# Patient Record
Sex: Female | Born: 1990 | Hispanic: Yes | Marital: Married | Smoking: Never smoker
Health system: Southern US, Community
[De-identification: ages and names within clinical notes are randomized; demographics above are authoritative.]

## PROBLEM LIST (undated history)

## (undated) DIAGNOSIS — O24419 Gestational diabetes mellitus in pregnancy, unspecified control: Secondary | ICD-10-CM

## (undated) DIAGNOSIS — M5430 Sciatica, unspecified side: Secondary | ICD-10-CM

## (undated) DIAGNOSIS — F419 Anxiety disorder, unspecified: Secondary | ICD-10-CM

## (undated) DIAGNOSIS — E039 Hypothyroidism, unspecified: Secondary | ICD-10-CM

## (undated) DIAGNOSIS — T7840XA Allergy, unspecified, initial encounter: Secondary | ICD-10-CM

## (undated) DIAGNOSIS — E282 Polycystic ovarian syndrome: Secondary | ICD-10-CM

## (undated) DIAGNOSIS — M549 Dorsalgia, unspecified: Secondary | ICD-10-CM

## (undated) HISTORY — DX: Sciatica, unspecified side: M54.30

## (undated) HISTORY — PX: NO PAST SURGERIES: SHX2092

## (undated) HISTORY — DX: Sciatica, unspecified side: M54.9

## (undated) HISTORY — DX: Gestational diabetes mellitus in pregnancy, unspecified control: O24.419

## (undated) HISTORY — DX: Polycystic ovarian syndrome: E28.2

## (undated) HISTORY — DX: Anxiety disorder, unspecified: F41.9

## (undated) HISTORY — DX: Allergy, unspecified, initial encounter: T78.40XA

---

## 2011-11-28 ENCOUNTER — Emergency Department: Payer: Self-pay | Admitting: Emergency Medicine

## 2013-08-01 ENCOUNTER — Encounter: Payer: Self-pay | Admitting: Podiatrist

## 2013-08-01 ENCOUNTER — Ambulatory Visit: Payer: Self-pay | Admitting: Podiatrist

## 2013-08-23 ENCOUNTER — Ambulatory Visit: Payer: Self-pay | Admitting: Podiatry

## 2013-10-01 ENCOUNTER — Ambulatory Visit: Payer: Self-pay | Admitting: Emergency Medicine

## 2013-10-01 LAB — PREGNANCY, URINE: PREGNANCY TEST, URINE: NEGATIVE m[IU]/mL

## 2014-06-06 NOTE — L&D Delivery Note (Signed)
Delivery Note At 10:26 PM a viable female was delivered via Vaginal, Spontaneous Delivery (Presentation: Right Occiput Anterior).  APGAR: 8, 9; weight  .   Placenta status: Intact, Spontaneous.  Cord: 3 vessels with the following complications: None.  Cord pH: N/A  Anesthesia: Epidural  Episiotomy: None Lacerations: 2nd degree;Labial Suture Repair: 2.0 3.0 vicryl Est. Blood Loss (mL): 300  Mom to postpartum.  Baby to Couplet care / Skin to Skin.  20 minute second stage. Infant with spontaneous cry, to maternal abdomen for delayed cord clamping, cut by pt's mother after pulsation stopped. Infant to skin-to-skin with mother.   Baby's Name: Carla [name] 02/16/2015, 11:07 PM

## 2014-07-08 LAB — OB RESULTS CONSOLE ANTIBODY SCREEN: Antibody Screen: NEGATIVE

## 2014-07-08 LAB — OB RESULTS CONSOLE HEPATITIS B SURFACE ANTIGEN: Hepatitis B Surface Ag: NEGATIVE

## 2014-07-08 LAB — OB RESULTS CONSOLE ABO/RH: RH TYPE: POSITIVE

## 2014-07-08 LAB — OB RESULTS CONSOLE VARICELLA ZOSTER ANTIBODY, IGG: VARICELLA IGG: NON-IMMUNE/NOT IMMUNE

## 2014-07-08 LAB — OB RESULTS CONSOLE RUBELLA ANTIBODY, IGM: RUBELLA: IMMUNE

## 2014-12-03 LAB — OB RESULTS CONSOLE RPR: RPR: NONREACTIVE

## 2014-12-03 LAB — OB RESULTS CONSOLE HIV ANTIBODY (ROUTINE TESTING): HIV: NONREACTIVE

## 2015-01-20 LAB — OB RESULTS CONSOLE GC/CHLAMYDIA
Chlamydia: NEGATIVE
GC PROBE AMP, GENITAL: NEGATIVE

## 2015-01-20 LAB — OB RESULTS CONSOLE GBS: GBS: NEGATIVE

## 2015-02-16 ENCOUNTER — Encounter: Payer: Self-pay | Admitting: Advanced Practice Midwife

## 2015-02-16 ENCOUNTER — Inpatient Hospital Stay: Payer: BC Managed Care – PPO | Admitting: Anesthesiology

## 2015-02-16 ENCOUNTER — Inpatient Hospital Stay
Admission: EM | Admit: 2015-02-16 | Discharge: 2015-02-18 | DRG: 775 | Disposition: A | Discharge status: Home / Self Care | Payer: BC Managed Care – PPO | Attending: Obstetrics and Gynecology | Admitting: Obstetrics and Gynecology

## 2015-02-16 DIAGNOSIS — E039 Hypothyroidism, unspecified: Secondary | ICD-10-CM | POA: Diagnosis present

## 2015-02-16 DIAGNOSIS — Z3A39 39 weeks gestation of pregnancy: Secondary | ICD-10-CM | POA: Diagnosis not present

## 2015-02-16 DIAGNOSIS — O99284 Endocrine, nutritional and metabolic diseases complicating childbirth: Secondary | ICD-10-CM | POA: Diagnosis present

## 2015-02-16 HISTORY — DX: Hypothyroidism, unspecified: E03.9

## 2015-02-16 LAB — CBC
HCT: 38.9 % (ref 35.0–47.0)
HEMOGLOBIN: 13 g/dL (ref 12.0–16.0)
MCH: 28 pg (ref 26.0–34.0)
MCHC: 33.4 g/dL (ref 32.0–36.0)
MCV: 83.9 fL (ref 80.0–100.0)
Platelets: 271 10*3/uL (ref 150–440)
RBC: 4.64 MIL/uL (ref 3.80–5.20)
RDW: 14 % (ref 11.5–14.5)
WBC: 13.9 10*3/uL — ABNORMAL HIGH (ref 3.6–11.0)

## 2015-02-16 LAB — CHLAMYDIA/NGC RT PCR (ARMC ONLY)
CHLAMYDIA TR: NOT DETECTED
N gonorrhoeae: NOT DETECTED

## 2015-02-16 LAB — ABO/RH: ABO/RH(D): O POS

## 2015-02-16 LAB — TYPE AND SCREEN
ABO/RH(D): O POS
Antibody Screen: NEGATIVE

## 2015-02-16 MED ORDER — BUPIVACAINE HCL (PF) 0.25 % IJ SOLN
INTRAMUSCULAR | Status: DC | PRN
  Administered 2015-02-16: 5 mL via EPIDURAL
  Administered 2015-02-16: 2 mL via EPIDURAL

## 2015-02-16 MED ORDER — FENTANYL 2.5 MCG/ML W/ROPIVACAINE 0.2% IN NS 100 ML EPIDURAL INFUSION (ARMC-ANES)
EPIDURAL | Status: AC
  Administered 2015-02-16: 10 mL/h via EPIDURAL
  Filled 2015-02-16: qty 100

## 2015-02-16 MED ORDER — BUTORPHANOL TARTRATE 1 MG/ML IJ SOLN: 2.0000 mg | INTRAMUSCULAR | Status: DC | PRN

## 2015-02-16 MED ORDER — OXYTOCIN 10 UNIT/ML IJ SOLN
INTRAMUSCULAR | Status: AC
  Filled 2015-02-16: qty 2

## 2015-02-16 MED ORDER — LACTATED RINGERS IV SOLN
INTRAVENOUS | Status: DC
  Administered 2015-02-16 (×2): via INTRAVENOUS

## 2015-02-16 MED ORDER — OXYTOCIN 40 UNITS IN LACTATED RINGERS INFUSION - SIMPLE MED
62.5000 mL/h | INTRAVENOUS | Status: DC
  Administered 2015-02-16: 62.5 mL/h via INTRAVENOUS
  Filled 2015-02-16: qty 1000

## 2015-02-16 MED ORDER — OXYTOCIN BOLUS FROM INFUSION
500.0000 mL | INTRAVENOUS | Status: DC
  Administered 2015-02-16: 500 mL via INTRAVENOUS

## 2015-02-16 MED ORDER — LIDOCAINE-EPINEPHRINE (PF) 1.5 %-1:200000 IJ SOLN
INTRAMUSCULAR | Status: DC | PRN
  Administered 2015-02-16: 2 mL via PERINEURAL

## 2015-02-16 MED ORDER — LACTATED RINGERS IV SOLN
500.0000 mL | INTRAVENOUS | Status: DC | PRN
  Administered 2015-02-16: 750 mL via INTRAVENOUS

## 2015-02-16 MED ORDER — MISOPROSTOL 200 MCG PO TABS
ORAL_TABLET | ORAL | Status: AC
  Filled 2015-02-16: qty 4

## 2015-02-16 MED ORDER — ACETAMINOPHEN 325 MG PO TABS: 650.0000 mg | ORAL_TABLET | ORAL | Status: DC | PRN

## 2015-02-16 MED ORDER — LIDOCAINE HCL (PF) 1 % IJ SOLN
INTRAMUSCULAR | Status: AC
  Administered 2015-02-16: 30 mL
  Filled 2015-02-16: qty 30

## 2015-02-16 MED ORDER — AMMONIA AROMATIC IN INHA
RESPIRATORY_TRACT | Status: AC
  Filled 2015-02-16: qty 10

## 2015-02-16 MED ORDER — BUTORPHANOL TARTRATE 1 MG/ML IJ SOLN
1.0000 mg | INTRAMUSCULAR | Status: DC | PRN
  Administered 2015-02-16: 1 mg via INTRAVENOUS
  Filled 2015-02-16: qty 1

## 2015-02-16 NOTE — Progress Notes (Signed)
L&D Note  02/16/2015 - 6:30 PM  24 y.o. G1P0 [redacted]w[redacted]d    Subjective:  Requesting pain medication  Objective:   Filed Vitals:   02/16/15 1022 02/16/15 1154 02/16/15 1405 02/16/15 1651  BP: 126/85 118/78 126/83 130/82  Pulse: 86 89 89 90  Temp:    98.1 F (36.7 C)  TempSrc:    Oral  Resp:    18  Height:      Weight:        Current Vital Signs 24h Vital Sign Ranges  T 98.1 F (36.7 C) Temp  Avg: 97.8 F (36.6 C)  Min: 97.3 F (36.3 C)  Max: 98.1 F (36.7 C)  BP 130/82 mmHg BP  Min: 118/78  Max: 134/88  HR 90 Pulse  Avg: 91  Min: 86  Max: 100  RR 18 Resp  Avg: 18  Min: 18  Max: 18  SaO2     No Data Recorded       24 Hour I/O Current Shift I/O  Time Ins Outs   09/12 0701 - 09/12 1900 In: 360 [P.O.:360] Out: -     FHR: category 1 tracing Toco: not graphing well, ctx q 3 min per pt SVE: 6-7/90-100/0   Assessment :  IUP at [redacted]w[redacted]d, labor     Plan:  IV stadol  Anticipate vaginal delivery Consider IUPC if there is not significant cervical progress at next exam   Marta Antu, CNM

## 2015-02-16 NOTE — Anesthesia Preprocedure Evaluation (Signed)
Anesthesia Evaluation  Patient identified by MRN, date of birth, ID band Patient awake    Reviewed: Allergy & Precautions, NPO status , Patient's Chart, lab work & pertinent test results  Airway Mallampati: III       Dental no notable dental hx.    Pulmonary neg pulmonary ROS,    Pulmonary exam normal        Cardiovascular negative cardio ROS Normal cardiovascular exam     Neuro/Psych    GI/Hepatic negative GI ROS, Neg liver ROS,   Endo/Other  Hypothyroidism   Renal/GU negative Renal ROS  negative genitourinary   Musculoskeletal   Abdominal Normal abdominal exam  (+)   Peds negative pediatric ROS (+)  Hematology negative hematology ROS (+)   Anesthesia Other Findings   Reproductive/Obstetrics                             Anesthesia Physical Anesthesia Plan  ASA: III  Anesthesia Plan: Epidural   Post-op Pain Management:    Induction:   Airway Management Planned: Natural Airway  Additional Equipment:   Intra-op Plan:   Post-operative Plan:   Informed Consent: I have reviewed the patients History and Physical, chart, labs and discussed the procedure including the risks, benefits and alternatives for the proposed anesthesia with the patient or authorized representative who has indicated his/her understanding and acceptance.     Plan Discussed with: CRNA  Anesthesia Plan Comments:         Anesthesia Quick Evaluation

## 2015-02-16 NOTE — Progress Notes (Signed)
L&D Note  02/16/2015 - 8:13 PM  24 y.o. G1P0 [redacted]w[redacted]d   [name] is admitted for labor   Subjective:  Stadol helped some with pain  Objective:   Filed Vitals:   02/16/15 1022 02/16/15 1154 02/16/15 1405 02/16/15 1651  BP: 126/85 118/78 126/83 130/82  Pulse: 86 89 89 90  Temp:    98.1 F (36.7 C)  TempSrc:    Oral  Resp:    18  Height:      Weight:        Current Vital Signs 24h Vital Sign Ranges  T 98.1 F (36.7 C) Temp  Avg: 97.8 F (36.6 C)  Min: 97.3 F (36.3 C)  Max: 98.1 F (36.7 C)  BP 130/82 mmHg BP  Min: 118/78  Max: 134/88  HR 90 Pulse  Avg: 91  Min: 86  Max: 100  RR 18 Resp  Avg: 18  Min: 18  Max: 18  SaO2     No Data Recorded       24 Hour I/O Current Shift I/O  Time Ins Outs        FHR: category 1 tracing Toco: not graphing well SVE: 6-7/90/0   Assessment :  IUP at [redacted]w[redacted]d, labor    Plan:  IUPC placed - evaluate for labor adequacy and begin pitocin augmentation prn Anticipate vaginal delivery May have another dose of stadol, encouraged pt to consider epidural if desired  Marta Antu, [place]

## 2015-02-16 NOTE — OB Triage Note (Signed)
Received to LD with c/o contractions every 5 minutes

## 2015-02-16 NOTE — H&P (Signed)
Obstetric History and Physical  Carla [name] is a 24 y.o. G1P0 with Estimated Date of Delivery: 02/22/15 per 7 wk [place] who presents at [redacted]w[redacted]d  presenting for contractions. Patient states she has been having regular contractions, no vaginal bleeding, intact membranes, with active fetal movement.    Prenatal Course Source of Care: [facility]  with onset of care at 5 weeks Pregnancy complications or risks: Hypothyroidism - on Synthroid through pregnancy, most recent labs: TSH 0.660, free T4 1.22 Patient Active Problem List   Diagnosis Date Noted  . Indication for care in labor and delivery, antepartum 02/16/2015   She plans to breastfeed She desires undecided for postpartum contraception.   Prenatal labs and studies: ABO, Rh: O+  Antibody: neg  Rubella: Immune Varicella: non-immune RPR:  NR HBsAg:  neg HIV: Neg GC/CT: neg/neg GBS: neg 1 hr Glucola: elevated 1 hr with normal 3 hr at 28 wks   Genetic screening: declined    Prenatal Transfer Tool   Past Medical History  Diagnosis Date  . Hypothyroidism     Past Surgical History  Procedure Laterality Date  . No past surgeries      OB History  Gravida Para Term Preterm AB SAB TAB Ectopic Multiple Living  1             # Outcome Date GA Lbr Len/2nd Weight Sex Delivery Anes PTL Lv  1 Current               Social History   Social History  . Marital Status: Unknown    Spouse Name: N/A  . Number of Children: N/A  . Years of Education: N/A   Social History Main Topics  . Smoking status: Never Smoker   . Smokeless tobacco: Never Used  . Alcohol Use: No  . Drug Use: No  . Sexual Activity: Yes   Other Topics Concern  . None   Social History Narrative    No family history on file.  Prescriptions prior to admission  Medication Sig Dispense Refill Last Dose  . levothyroxine (SYNTHROID, LEVOTHROID) 100 MCG tablet Take 100 mcg by mouth daily.  5   . Prenatal Vit-Fe Fumarate-FA (MULTIVITAMIN-PRENATAL) 27-0.8 MG TABS  tablet Take 1 tablet by mouth daily at 12 noon.       Allergies  Allergen Reactions  . Kiwi Extract   . Pineapple     Review of Systems: Negative except for what is mentioned in HPI.  Physical Exam: BP 118/78 mmHg  Pulse 89  Temp(Src) 97.3 F (36.3 C) (Oral)  Resp 18  Ht 5\' 2"  (1.575 m)  Wt 230 lb (104.327 kg)  BMI 42.06 kg/m2 GENERAL: Well-developed, well-nourished female in no acute distress.  LUNGS: Clear to auscultation bilaterally.  HEART: Regular rate and rhythm. ABDOMEN: Soft, nontender, nondistended, gravid. EXTREMITIES: Nontender, no edema Cervical Exam: Dilatation 5 cm   Effacement 80 %   Station -2  (changed from 3-4 cm per RN on initial presentation) Presentation: cephalic FHT: Category: 1 Baseline rate 130 bpm   Variability moderate  Accelerations present   Decelerations none Contractions: Every 3-4 mins   Pertinent Labs/Studies:   No results found for this or any previous visit (from the past 24 hour(s)).  Assessment : IUP at [redacted]w[redacted]d in active labor  Plan: Admit for labor and Observe for cervical change

## 2015-02-16 NOTE — Progress Notes (Signed)
L&D Note  02/16/2015 - 4:22 PM  24 y.o. G1P0 [redacted]w[redacted]d   [name] is admitted for labor   Subjective:  Feeling ctx, not as frequent as previously  Objective:   Filed Vitals:   02/16/15 1021 02/16/15 1022 02/16/15 1154 02/16/15 1405  BP:  126/85 118/78 126/83  Pulse:  86 89 89  Temp: 97.3 F (36.3 C)     TempSrc: Oral     Resp:      Height:      Weight:        Current Vital Signs 24h Vital Sign Ranges  T 97.3 F (36.3 C) Temp  Avg: 97.7 F (36.5 C)  Min: 97.3 F (36.3 C)  Max: 98.1 F (36.7 C)  BP 126/83 mmHg BP  Min: 118/78  Max: 134/88  HR 89 Pulse  Avg: 91.1  Min: 86  Max: 100  RR 18 Resp  Avg: 18  Min: 18  Max: 18  SaO2     No Data Recorded       24 Hour I/O Current Shift I/O  Time Ins Outs   09/12 0701 - 09/12 1900 In: 360 [P.O.:360] Out: -     FHR: category 1 tracing Toco: q 4-8 min SVE: 6/80/-2   Assessment :  IUP at [redacted]w[redacted]d, labor    Plan:  AROM with light meconium staining noted. Reviewed POM at delivery with meconium with pt and mother.  Anticipate vaginal delivery.  IV meds as desired by pt  Marta Antu, CNM

## 2015-02-16 NOTE — Discharge Summary (Signed)
Obstetrical Discharge Summary  Date of Admission: 02/16/2015 Date of Discharge: 02/18/2015  Primary OB: [facility]  Gestational Age at Delivery: [redacted]w[redacted]d   Antepartum complications: none Reason for Admission: Labor Date of Delivery: 02/16/15  Delivered By: T. Brothers, CNM Delivery Type: spontaneous vaginal delivery Intrapartum complications/course: None Anesthesia: epidural Placenta: spontaneous. Intact: yes. To pathology: no.  Laceration: 2nd degree and labial Episiotomy: none Baby: Liveborn female, APGARs8/9, weight 6#9oz/ [place].  Exam : General-happy, pleasant, tired, in NAD. BP 116/76 mmHg  Pulse 91  Temp(Src) 98 F (36.7 C) (Oral)  Resp 20  Ht 5\' 2"  (1.575 m)  Wt 230 lb (104.327 kg)  BMI 42.06 kg/m2  SpO2 100%  Breastfeeding? Unknown  FF at U-2/ML/NT Lochia: small Laceration: C+D+I, healing well Extremities: no calf tenderness  Postpartum course: Carla [name]'s postpartum course has been uncomplicated. She has tolerated a regular diet, is voiding without difficulty, and caring for self and baby with minimal assistance. She was discharged on PPD #2 at which time she was afebrile, had minimal lochia, and was breastfeeding. Disposition: Home  Rh Immune globulin given: not applicable Rubella vaccine given: no Varicella vaccine given: ordered prior to discharge Tdap vaccine given in AP or PP setting: yes Flu vaccine given in AP or PP setting: ordered prior to discharge  Contraception: minipill  Prenatal/Postnatal Panel: O POS//Rubella Immune//Varicella Not immune//RPR negative//HIV negative/HepB Surface Ag negative//pap no abnormalities (date: 06/23/2014)//plans to breastfeed  Plan:  Carla [name] was discharged to home in good condition. Follow-up appointment with any provider in 6 weeks  Discharge Medications:   Medication List    TAKE these medications        benzocaine-Menthol 20-0.5 % Aero  Commonly known as:  DERMOPLAST  Apply 1 application topically as needed  for irritation (perineal discomfort).     docusate sodium 100 MG capsule  Commonly known as:  COLACE  Take 1 capsule (100 mg total) by mouth 2 (two) times daily as needed for mild constipation.     ibuprofen 600 MG tablet  Commonly known as:  ADVIL,MOTRIN  Take 1 tablet (600 mg total) by mouth every 6 (six) hours as needed for mild pain, moderate pain or cramping.     lanolin Oint  Apply 1 application topically as needed (for breast care).     levothyroxine 100 MCG tablet  Commonly known as:  SYNTHROID, LEVOTHROID  Take 100 mcg by mouth daily.     multivitamin-prenatal 27-0.8 MG Tabs tablet  Take 1 tablet by mouth daily at 12 noon.     varicella virus vaccine live 1350 PFU/0.5ML Inj injection  Commonly known as:  VARIVAX  Inject 0.5 mLs into the skin tomorrow at 10 am.       Farrel Conners, CNM  02/18/2015

## 2015-02-16 NOTE — Anesthesia Procedure Notes (Signed)
Epidural Patient location during procedure: OB Start time: 02/16/2015 9:25 PM End time: 02/16/2015 9:46 PM  Staffing Anesthesiologist: Elijio Miles F Performed by: anesthesiologist   Preanesthetic Checklist Completed: patient identified, site marked, surgical consent, pre-op evaluation, timeout performed, IV checked, risks and benefits discussed, monitors and equipment checked and at surgeon's request  Epidural Patient position: sitting Prep: Betadine Patient monitoring: heart rate and blood pressure Approach: midline Location: L3-L4 Injection technique: LOR air and LOR saline  Needle:  Needle type: Tuohy  Needle gauge: 18 G Needle length: 9 cm Needle insertion depth: 6 cm Catheter type: closed end flexible Catheter size: 20 Guage Catheter at skin depth: 10 cm Test dose: negative and 2% lidocaine with Epi 1:200 K  Assessment Sensory level: T8  Additional Notes Reason for block:at surgeon's request

## 2015-02-17 LAB — HEMOGLOBIN AND HEMATOCRIT, BLOOD
HEMATOCRIT: 36.4 % (ref 35.0–47.0)
HEMOGLOBIN: 11.9 g/dL — AB (ref 12.0–16.0)

## 2015-02-17 LAB — RPR: RPR Ser Ql: NONREACTIVE

## 2015-02-17 MED ORDER — FERROUS SULFATE 325 (65 FE) MG PO TABS
325.0000 mg | ORAL_TABLET | Freq: Every day | ORAL | Status: DC
  Administered 2015-02-17 – 2015-02-18 (×2): 325 mg via ORAL
  Filled 2015-02-17 (×2): qty 1

## 2015-02-17 MED ORDER — SIMETHICONE 80 MG PO CHEW: 80.0000 mg | CHEWABLE_TABLET | ORAL | Status: DC | PRN

## 2015-02-17 MED ORDER — ONDANSETRON HCL 4 MG/2ML IJ SOLN: 4.0000 mg | INTRAMUSCULAR | Status: DC | PRN

## 2015-02-17 MED ORDER — DOCUSATE SODIUM 100 MG PO CAPS
100.0000 mg | ORAL_CAPSULE | Freq: Two times a day (BID) | ORAL | Status: DC
  Administered 2015-02-17 – 2015-02-18 (×2): 100 mg via ORAL
  Filled 2015-02-17 (×2): qty 1

## 2015-02-17 MED ORDER — PRENATAL MULTIVITAMIN CH
1.0000 | ORAL_TABLET | Freq: Every day | ORAL | Status: DC
  Administered 2015-02-17 – 2015-02-18 (×2): 1 via ORAL
  Filled 2015-02-17 (×2): qty 1

## 2015-02-17 MED ORDER — OXYCODONE-ACETAMINOPHEN 5-325 MG PO TABS: 1.0000 | ORAL_TABLET | ORAL | Status: DC | PRN

## 2015-02-17 MED ORDER — LEVOTHYROXINE SODIUM 50 MCG PO TABS
100.0000 ug | ORAL_TABLET | Freq: Every day | ORAL | Status: DC
  Administered 2015-02-17 – 2015-02-18 (×2): 100 ug via ORAL
  Filled 2015-02-17 (×2): qty 2

## 2015-02-17 MED ORDER — ONDANSETRON HCL 4 MG PO TABS: 4.0000 mg | ORAL_TABLET | ORAL | Status: DC | PRN

## 2015-02-17 MED ORDER — VARICELLA VIRUS VACCINE LIVE 1350 PFU/0.5ML IJ SUSR
0.5000 mL | INTRAMUSCULAR | Status: AC
  Administered 2015-02-18: 0.5 mL via SUBCUTANEOUS
  Filled 2015-02-17: qty 0.5

## 2015-02-17 MED ORDER — IBUPROFEN 600 MG PO TABS
600.0000 mg | ORAL_TABLET | Freq: Four times a day (QID) | ORAL | Status: DC
  Administered 2015-02-17 – 2015-02-18 (×5): 600 mg via ORAL
  Filled 2015-02-17 (×5): qty 1

## 2015-02-17 MED ORDER — LACTATED RINGERS IV SOLN
INTRAVENOUS | Status: DC
Start: 2015-02-17 — End: 2015-02-18
  Administered 2015-02-17: 07:00:00 via INTRAVENOUS

## 2015-02-17 MED ORDER — ACETAMINOPHEN 325 MG PO TABS: 650.0000 mg | ORAL_TABLET | ORAL | Status: DC | PRN

## 2015-02-17 MED ORDER — BENZOCAINE-MENTHOL 20-0.5 % EX AERO
1.0000 "application " | INHALATION_SPRAY | CUTANEOUS | Status: DC | PRN
  Filled 2015-02-17: qty 56

## 2015-02-17 MED ORDER — DIBUCAINE 1 % RE OINT: 1.0000 "application " | TOPICAL_OINTMENT | RECTAL | Status: DC | PRN

## 2015-02-17 MED ORDER — DIPHENHYDRAMINE HCL 25 MG PO CAPS: 25.0000 mg | ORAL_CAPSULE | Freq: Four times a day (QID) | ORAL | Status: DC | PRN

## 2015-02-17 MED ORDER — LANOLIN HYDROUS EX OINT: TOPICAL_OINTMENT | CUTANEOUS | Status: DC | PRN

## 2015-02-17 MED ORDER — ZOLPIDEM TARTRATE 5 MG PO TABS: 5.0000 mg | ORAL_TABLET | Freq: Every evening | ORAL | Status: DC | PRN

## 2015-02-17 MED ORDER — MAGNESIUM HYDROXIDE 400 MG/5ML PO SUSP: 30.0000 mL | ORAL | Status: DC | PRN

## 2015-02-17 MED ORDER — WITCH HAZEL-GLYCERIN EX PADS: 1.0000 "application " | MEDICATED_PAD | CUTANEOUS | Status: DC | PRN

## 2015-02-17 NOTE — Anesthesia Postprocedure Evaluation (Signed)
  Anesthesia Post-op Note  Patient: Carla [name]  Procedure(s) Performed: * No procedures listed *  Anesthesia type:Epidural  Patient location: 338  Post pain: Pain level controlled  Post assessment: Post-op Vital signs reviewed, Patient's Cardiovascular Status Stable, Respiratory Function Stable, Patent Airway and No signs of Nausea or vomiting  Post vital signs: Reviewed and stable  Last Vitals:  Filed Vitals:   02/17/15 0326  BP: 113/61  Pulse: 96  Temp: 36.8 C  Resp: 18    Level of consciousness: awake, alert  and patient cooperative  Complications: No apparent anesthesia complications

## 2015-02-17 NOTE — Progress Notes (Signed)
Post Partum Day1, 12 hours pp Subjective: no complaints, voiding without difficulty, tolerating regular diet.  Objective: Blood pressure 113/68, pulse 89, temperature 97.9 F (36.6 C), temperature source Oral, resp. rate 18, height  (1.575 m), weight 230 lb (104.327 kg), SpO2 97 %, unknown if currently breastfeeding.  Physical Exam:  General: alert and no distress Lochia: appropriate Uterine Fundus: firm/ U/ ML/ NT DVT Evaluation: No evidence of DVT seen on physical exam.   Recent Labs  02/16/15 1230 02/17/15 0635  HGB 13.0 11.9*  HCT 38.9 36.4  WBC 13.9*  --   PLT 271  --     Assessment/Plan: Stable ppd #1 DC IV Resumed Synthroid this AM Ambulate    LOS: 1 day   Carla [name] 02/17/2015, 9:24 AM

## 2015-02-18 MED ORDER — INFLUENZA VAC SPLIT QUAD 0.5 ML IM SUSY: 0.5000 mL | PREFILLED_SYRINGE | INTRAMUSCULAR | Status: DC

## 2015-02-18 MED ORDER — INFLUENZA VAC SPLIT QUAD 0.5 ML IM SUSY
0.5000 mL | PREFILLED_SYRINGE | Freq: Once | INTRAMUSCULAR | Status: AC
  Administered 2015-02-18: 0.5 mL via INTRAMUSCULAR
  Filled 2015-02-18: qty 0.5

## 2015-02-18 MED ORDER — VARICELLA VIRUS VACCINE LIVE 1350 PFU/0.5ML IJ SUSR: 0.5000 mL | INTRAMUSCULAR | Status: DC

## 2015-02-18 MED ORDER — BENZOCAINE-MENTHOL 20-0.5 % EX AERO: 1.0000 "application " | INHALATION_SPRAY | CUTANEOUS | Status: DC | PRN

## 2015-02-18 MED ORDER — IBUPROFEN 600 MG PO TABS: 600.0000 mg | ORAL_TABLET | Freq: Four times a day (QID) | ORAL | Status: DC | PRN

## 2015-02-18 MED ORDER — LANOLIN HYDROUS EX OINT: 1.0000 "application " | TOPICAL_OINTMENT | CUTANEOUS | Status: DC | PRN

## 2015-02-18 MED ORDER — DOCUSATE SODIUM 100 MG PO CAPS: 100.0000 mg | ORAL_CAPSULE | Freq: Two times a day (BID) | ORAL | Status: DC | PRN

## 2015-02-18 NOTE — Lactation Note (Signed)
This note was copied from the chart of Carla Alec Mcphee. Lactation Consultation Note  Patient Name: Carla [name] [id number] Date: 02/18/2015  Mom reports that baby is breastfeeding better today. Her only question was about preparing to go back to work in 6 weeks. I educated her about pumping, storing , handling breast milk; and pump options. She denies needing more info on positon/latch; how to tell baby gets enough/ growth spurts; engorgement, etc. She  BF booklet and [facility] info. F/U with peds for bili and wt. Check.    Maternal Data    Feeding Feeding Type: Breast Fed Length of feed: 30 min  LATCH Score/Interventions                      Lactation Tools Discussed/Used     Consult Status      Sunday Corn 02/18/2015, 1:11 PM

## 2015-02-18 NOTE — Discharge Instructions (Signed)
Vaginal Delivery, Care After Refer to this sheet in the next few weeks. These discharge instructions provide you with information on caring for yourself after delivery. Your caregiver may also give you specific instructions. Your treatment has been planned according to the most current medical practices available, but problems sometimes occur. Call your caregiver if you have any problems or questions after you go home. HOME CARE INSTRUCTIONS 1. Take over-the-counter or prescription medicines only as directed by your caregiver or pharmacist. 2. Do not drink alcohol, especially if you are breastfeeding or taking medicine to relieve pain. 3. Do not smoke tobacco. 4. Continue to use good perineal care. Good perineal care includes: 1. Wiping your perineum from back to front 2. Keeping your perineum clean. 3. You can do sitz baths twice a day, to help keep this area clean 5. Do not use tampons, douche or have sex until your caregiver says it is okay. 6. Shower only and avoid sitting in submerged water, aside from sitz baths 7. Wear a well-fitting bra that provides breast support. 8. Eat healthy foods. 9. Drink enough fluids to keep your urine clear or pale yellow. 10. Eat high-fiber foods such as whole grain cereals and breads, brown rice, beans, and fresh fruits and vegetables every day. These foods may help prevent or relieve constipation. 11. Avoid constipation with high fiber foods or medications, such as miralax or metamucil 12. Follow your caregiver's recommendations regarding resumption of activities such as climbing stairs, driving, lifting, exercising, or traveling. 13. Talk to your caregiver about resuming sexual activities. Resumption of sexual activities is dependent upon your risk of infection, your rate of healing, and your comfort and desire to resume sexual activity. 14. Try to have someone help you with your household activities and your newborn for at least a few days after you leave  the hospital. 15. Rest as much as possible. Try to rest or take a nap when your newborn is sleeping. 16. Increase your activities gradually. 17. Keep all of your scheduled postpartum appointments. It is very important to keep your scheduled follow-up appointments. At these appointments, your caregiver will be checking to make sure that you are healing physically and emotionally. SEEK MEDICAL CARE IF:   You are passing large clots from your vagina. Save any clots to show your caregiver.  You have a foul smelling discharge from your vagina.  You have trouble urinating.  You are urinating frequently.  You have pain when you urinate.  You have a change in your bowel movements.  You have increasing redness, pain, or swelling near your vaginal incision (episiotomy) or vaginal tear.  You have pus draining from your episiotomy or vaginal tear.  Your episiotomy or vaginal tear is separating.  You have painful, hard, or reddened breasts.  You have a severe headache.  You have blurred vision or see spots.  You feel sad or depressed.  You have thoughts of hurting yourself or your newborn.  You have questions about your care, the care of your newborn, or medicines.  You are dizzy or light-headed.  You have a rash.  You have nausea or vomiting.  You were breastfeeding and have not had a menstrual period within 12 weeks after you stopped breastfeeding.  You are not breastfeeding and have not had a menstrual period by the 12th week after delivery.  You have a fever. SEEK IMMEDIATE MEDICAL CARE IF:   You have persistent pain.  You have chest pain.  You have shortness of breath.    You faint.  You have leg pain.  You have stomach pain.  Your vaginal bleeding saturates two or more sanitary pads in 1 hour. MAKE SURE YOU:   Understand these instructions.  Will watch your condition.  Will get help right away if you are not doing well or get worse. Document Released:  05/20/2000 Document Revised: 10/07/2013 Document Reviewed: 01/18/2012 Exit[employer] Patient Information 2015 [employer]. This information is not intended to replace advice given to you by your health care provider. Make sure you discuss any questions you have with your health care provider.  Sitz Bath A sitz bath is a warm water bath taken in the sitting position. The water covers only the hips and butt (buttocks). We recommend using one that fits in the toilet, to help with ease of use and cleanliness. It may be used for either healing or cleaning purposes. Sitz baths are also used to relieve pain, itching, or muscle tightening (spasms). The water may contain medicine. Moist heat will help you heal and relax.  HOME CARE  Take 3 to 4 sitz baths a day. 18. Fill the bathtub half-full with warm water. 19. Sit in the water and open the drain a little. 20. Turn on the warm water to keep the tub half-full. Keep the water running constantly. 21. Soak in the water for 15 to 20 minutes. 22. After the sitz bath, pat the affected area dry. GET HELP RIGHT AWAY IF: You get worse instead of better. Stop the sitz baths if you get worse. MAKE SURE YOU:  Understand these instructions.  Will watch your condition.  Will get help right away if you are not doing well or get worse. Document Released: 06/30/2004 Document Revised: 02/15/2012 Document Reviewed: 09/20/2010 Exit[employer] Patient Information 2015 [employer]. This information is not intended to replace advice given to you by your health care provider. Make sure you discuss any questions you have with your health care provider.    

## 2015-03-30 ENCOUNTER — Encounter: Payer: Self-pay | Admitting: Emergency Medicine

## 2015-03-30 ENCOUNTER — Ambulatory Visit (INDEPENDENT_AMBULATORY_CARE_PROVIDER_SITE_OTHER): Payer: BC Managed Care – PPO

## 2015-03-30 ENCOUNTER — Ambulatory Visit
Admission: EM | Admit: 2015-03-30 | Discharge: 2015-03-30 | Disposition: A | Discharge status: Home / Self Care | Payer: BC Managed Care – PPO | Attending: Family Medicine | Admitting: Family Medicine

## 2015-03-30 DIAGNOSIS — M6588 Other synovitis and tenosynovitis, other site: Secondary | ICD-10-CM

## 2015-03-30 DIAGNOSIS — M659 Synovitis and tenosynovitis, unspecified: Secondary | ICD-10-CM

## 2015-03-30 NOTE — ED Provider Notes (Signed)
CSN: [id number]     Arrival date & time 03/30/15  1621 History   First MD Initiated Contact with Patient 03/30/15 1807     Chief Complaint  Patient presents with  . Wrist Pain   (Consider location/radiation/quality/duration/timing/severity/associated sxs/prior Treatment) HPI Comments: 24 yo female with a 1 week h/o left thumb and wrist pain, worse when moving her thumb. Denies any injuries, falls, trauma, numbness, tingling. States has a 60 month old infant at home and has been picking her up a lot.   The history is provided by the patient.    Past Medical History  Diagnosis Date  . Hypothyroidism    Past Surgical History  Procedure Laterality Date  . No past surgeries     No family history on file. Social History  Substance Use Topics  . Smoking status: Never Smoker   . Smokeless tobacco: Never Used  . Alcohol Use: No   OB History    Gravida Para Term Preterm AB TAB SAB Ectopic Multiple Living   1 1 1       0 0     Review of Systems  Allergies  Kiwi extract and Pineapple  Home Medications   Prior to Admission medications   Medication Sig Start Date End Date Taking? Authorizing Provider  benzocaine-Menthol (DERMOPLAST) 20-0.5 % AERO Apply 1 application topically as needed for irritation (perineal discomfort). 02/18/15  Yes Farrel Conners, CNM  docusate sodium (COLACE) 100 MG capsule Take 1 capsule (100 mg total) by mouth 2 (two) times daily as needed for mild constipation. 02/18/15  Yes Farrel Conners, CNM  ibuprofen (ADVIL,MOTRIN) 600 MG tablet Take 1 tablet (600 mg total) by mouth every 6 (six) hours as needed for mild pain, moderate pain or cramping. 02/18/15  Yes Farrel Conners, CNM  lanolin OINT Apply 1 application topically as needed (for breast care). 02/18/15  Yes Farrel Conners, CNM  levothyroxine (SYNTHROID, LEVOTHROID) 100 MCG tablet Take 100 mcg by mouth daily. 01/24/15  Yes Historical Provider, MD  norethindrone (MICRONOR,CAMILA,ERRIN) 0.35 MG tablet  Take 1 tablet by mouth daily.   Yes Historical Provider, MD  Prenatal Vit-Fe Fumarate-FA (MULTIVITAMIN-PRENATAL) 27-0.8 MG TABS tablet Take 1 tablet by mouth daily at 12 noon.   Yes Historical Provider, MD  varicella virus vaccine live (VARIVAX) 1350 PFU/0.5ML INJ injection Inject 0.5 mLs into the skin tomorrow at 10 am. 02/18/15  Yes Farrel Conners, CNM   Meds Ordered and Administered this Visit  Medications - No data to display  BP 106/68 mmHg  Pulse 79  Temp(Src) 97.8 F (36.6 C) (Tympanic)  Resp 16  Ht 5\' 2"  (1.575 m)  Wt 197 lb (89.359 kg)  BMI 36.02 kg/m2  SpO2 100%  LMP 03/23/2015  Breastfeeding? No No data found.   Physical Exam  Constitutional: She appears well-developed and well-nourished. No distress.  Musculoskeletal:       Left hand: She exhibits tenderness (over the thumb extensor tendons). She exhibits normal range of motion, no bony tenderness, normal two-point discrimination, normal capillary refill, no deformity, no laceration and no swelling. Normal sensation noted. Normal strength noted.       Hands: Skin: She is not diaphoretic.  Nursing note and vitals reviewed.   ED Course  Procedures (including critical care time)  Labs Review Labs Reviewed - No data to display  Imaging Review Dg Wrist Complete Left  03/30/2015  CLINICAL DATA:  24 year old female with pain in the radial side of the left wrist extending into the snuffbox area for the  past week. No history of injury. EXAM: LEFT WRIST - COMPLETE 3+ VIEW COMPARISON:  No priors. FINDINGS: Multiple views of the left wrist demonstrate no acute displaced fracture, subluxation, dislocation, or soft tissue abnormality. IMPRESSION: No acute radiographic abnormality of the left wrist. Electronically Signed   By: Trudie Reed M.D.   On: 03/30/2015 18:29     Visual Acuity Review  Right Eye Distance:   Left Eye Distance:   Bilateral Distance:    Right Eye Near:   Left Eye Near:    Bilateral Near:          MDM   1. Tenosynovitis of thumb    Discharge Medication List as of 03/30/2015  7:30 PM    1. x-ray results (negative) and diagnosis reviewed with patient 2. Recommend supportive treatment with otc NSAIDs, analgesics, rest, ice 3. Patient given thumb spica velcro splint 4. Follow prn if symptoms worsen or don't improve    Payton Mccallum, MD 03/31/15 [phone]

## 2015-03-30 NOTE — ED Notes (Signed)
Left wrist pain for 1 week. Hard to move thumb

## 2016-02-01 ENCOUNTER — Ambulatory Visit: Payer: Self-pay | Admitting: Unknown Physician Specialty

## 2016-03-02 ENCOUNTER — Ambulatory Visit: Payer: Self-pay | Admitting: Unknown Physician Specialty

## 2016-10-04 ENCOUNTER — Ambulatory Visit (INDEPENDENT_AMBULATORY_CARE_PROVIDER_SITE_OTHER): Payer: 59 | Admitting: Obstetrics and Gynecology

## 2016-10-04 ENCOUNTER — Encounter: Payer: Self-pay | Admitting: Obstetrics and Gynecology

## 2016-10-04 VITALS — BP 116/74 | Ht 62.0 in | Wt 210.0 lb

## 2016-10-04 DIAGNOSIS — N970 Female infertility associated with anovulation: Secondary | ICD-10-CM | POA: Diagnosis not present

## 2016-10-04 DIAGNOSIS — N914 Secondary oligomenorrhea: Secondary | ICD-10-CM | POA: Insufficient documentation

## 2016-10-04 MED ORDER — MEDROXYPROGESTERONE ACETATE 10 MG PO TABS: 10.0000 mg | ORAL_TABLET | Freq: Every day | ORAL | 0 refills | Status: DC

## 2016-10-04 NOTE — Progress Notes (Signed)
Obstetrics & Gynecology Office Visit   Chief Complaint  Patient presents with  . Menstrual Problem    pt has not had a cycle since 02/2016, started spotting this past Sunday.    History of Present Illness: 26 y.o. G74P1001 female who presents to discuss getting pregnant and irregular, infrequent menses. She started depo provera last spring and had two doses. Her last dose was in July last year and she stopped due to 25 pound weight gain. She had a menses in September, but has had none since then. She had spotting which began 2 days ago and continues. She had a negative pregnancy test about 2 weeks ago.  She has been on nothing for contraception since last September.  She also desires to get pregnant.  She states that she has had trouble in the past with her diagnosis of PCOS.  She actually got pregnant with her only child while taking contraception.  She is with the person who fathered her other child.  She would like to get something to help her ovulate, but will be in [place] this summer for two months.     Review of Systems: Review of Systems  Constitutional: Negative.   HENT: Negative.   Eyes: Negative.   Respiratory: Negative.   Cardiovascular: Negative.   Gastrointestinal: Negative.   Genitourinary: Negative.   Musculoskeletal: Negative.   Skin: Negative.   Neurological: Negative.   Psychiatric/Behavioral: Negative.     Past Medical History:  Diagnosis Date  . Hypothyroidism     Past Surgical History:  Procedure Laterality Date  . NO PAST SURGERIES      Gynecologic History: Patient's last menstrual period was 02/05/2016.  Obstetric History: G1P1001  Family History: Denies history of infertility and gynecologic cancer  Social History   Social History  . Marital status: Married    Spouse name: N/A  . Number of children: N/A  . Years of education: N/A   Occupational History  . Not on file.   Social History Main Topics  . Smoking status: Never Smoker  .  Smokeless tobacco: Never Used  . Alcohol use No  . Drug use: No  . Sexual activity: Yes    Birth control/ protection: None   Other Topics Concern  . Not on file   Social History Narrative  . No narrative on file    Allergies  Allergen Reactions  . Kiwi Extract   . Pineapple     Medications:   Medication Sig Start Date End Date Taking? Authorizing Provider  levothyroxine (SYNTHROID, LEVOTHROID) 100 MCG tablet Take 100 mcg by mouth daily. 01/24/15   Historical Provider, MD    Physical Exam BP 116/74   Ht 5\' 2"  (1.575 m)   Wt 210 lb (95.3 kg)   LMP 02/05/2016   BMI 38.41 kg/m  Patient's last menstrual period was 02/05/2016. Physical Exam  Constitutional: She is oriented to person, place, and time and well-developed, well-nourished, and in no distress. No distress.  HENT:  Head: Normocephalic and atraumatic.  Eyes: Conjunctivae are normal. No scleral icterus.  Neck: Normal range of motion. Neck supple. No thyromegaly present.  Cardiovascular: Normal rate and regular rhythm.   Pulmonary/Chest: Effort normal and breath sounds normal. No respiratory distress. She has no wheezes. She has no rales.  Abdominal: Soft. Bowel sounds are normal. She exhibits no distension and no mass. There is no tenderness. There is no rebound and no guarding.  Musculoskeletal: Normal range of motion. She exhibits no edema.  Neurological: She  is alert and oriented to person, place, and time. No cranial nerve deficit.  Skin: Skin is warm and dry. She is not diaphoretic. No erythema.  Psychiatric: Mood, affect and judgment normal.   Urine Pregnancy test: negative   Assessment: 26 y.o. G1P1001 with secondary oligomenorrhea and infertility, likely from anovulation given her self-reported history of PCOS.   Plan: Problem List Items Addressed This Visit    None    Visit Diagnoses    Infertility, anovulation    -  Primary   Relevant Medications   medroxyPROGESTERone (PROVERA) 10 MG tablet      Will try to provoke menses with Provera 10mg  po daily x 10 days. Discussed testing for ovulation. So, will have her return for day-21 progesterone level.   Discussed perhaps having her do one round of ovulation induction while in [place], if her test comes back as negative.    20 minutes spent in face to face discussion with > 50% spent in counseling and management of her secondary oligomenorrhea and infertility.   [name], MD 10/04/2016 5:31 PM

## 2016-10-15 ENCOUNTER — Encounter: Payer: Self-pay | Admitting: Obstetrics and Gynecology

## 2016-10-18 NOTE — Telephone Encounter (Signed)
Pt calling this afternoon to check on status of response to MyChart message. She is unsure if she needs to start a new medication. cb# [phone] thank you.

## 2016-10-25 ENCOUNTER — Telehealth: Payer: Self-pay | Admitting: Obstetrics and Gynecology

## 2016-10-25 ENCOUNTER — Other Ambulatory Visit: Payer: Self-pay | Admitting: Obstetrics and Gynecology

## 2016-10-25 DIAGNOSIS — N979 Female infertility, unspecified: Secondary | ICD-10-CM

## 2016-10-25 NOTE — Telephone Encounter (Signed)
[name], CMA  You 10 minutes ago (10:40 AM)    Please schedule appt with pt to come in for lab work per [employer] (Routing comment)     Conard Novak, MD  [name], CMA 47 minutes ago (10:03 AM)    Patient coming in on 6/1 for a lab. Please help her arrange an appointment for this. Thank you (Routing comment)

## 2016-10-25 NOTE — Telephone Encounter (Signed)
Pt is schedule 11/04/16 for labs

## 2016-11-04 ENCOUNTER — Other Ambulatory Visit: Payer: 59

## 2016-11-04 DIAGNOSIS — N979 Female infertility, unspecified: Secondary | ICD-10-CM

## 2016-11-05 LAB — PROGESTERONE: PROGESTERONE: 3.1 ng/mL

## 2016-11-09 ENCOUNTER — Encounter: Payer: Self-pay | Admitting: Obstetrics and Gynecology

## 2016-11-10 ENCOUNTER — Other Ambulatory Visit: Payer: Self-pay | Admitting: Obstetrics and Gynecology

## 2016-11-10 DIAGNOSIS — N970 Female infertility associated with anovulation: Secondary | ICD-10-CM

## 2016-11-10 MED ORDER — CLOMIPHENE CITRATE 50 MG PO TABS: 50.0000 mg | ORAL_TABLET | Freq: Every day | ORAL | 0 refills | Status: DC

## 2017-03-09 ENCOUNTER — Encounter: Payer: Self-pay | Admitting: Maternal Newborn

## 2017-03-09 ENCOUNTER — Ambulatory Visit (INDEPENDENT_AMBULATORY_CARE_PROVIDER_SITE_OTHER): Payer: BC Managed Care – PPO | Admitting: Maternal Newborn

## 2017-03-09 VITALS — BP 126/70 | HR 108 | Wt 222.0 lb

## 2017-03-09 DIAGNOSIS — O0990 Supervision of high risk pregnancy, unspecified, unspecified trimester: Secondary | ICD-10-CM | POA: Insufficient documentation

## 2017-03-09 DIAGNOSIS — N912 Amenorrhea, unspecified: Secondary | ICD-10-CM

## 2017-03-09 DIAGNOSIS — E669 Obesity, unspecified: Secondary | ICD-10-CM | POA: Insufficient documentation

## 2017-03-09 DIAGNOSIS — Z3480 Encounter for supervision of other normal pregnancy, unspecified trimester: Secondary | ICD-10-CM

## 2017-03-09 LAB — POCT URINE PREGNANCY: PREG TEST UR: POSITIVE — AB

## 2017-03-09 NOTE — Progress Notes (Signed)
03/09/2017   Chief Complaint: Amenorrhea, positive home pregnancy test, desires prenatal care.  Transfer of Care Patient: no  History of Present Illness: Ms. Boye is a 26 y.o. [name] [redacted]w[redacted]d based on Patient's last menstrual period was 11/12/2016 (exact date). with an Estimated Date of Delivery: 08/19/17, with the above CC.   Her periods were: irregular periods, last menstrual period November 12, 2016. She was using no method when she conceived.  She has Positive signs or symptoms of breast tenderness, fatigue, nausea of pregnancy. She has Negative signs or symptoms of miscarriage or preterm labor She identifies Positive Zika risk factors for her and her partner (travel to [place]). On any different medications around the time she conceived/early pregnancy: No  History of varicella: Yes   ROS: A 12-point review of systems was performed and negative, except as stated in the above HPI.  OBGYN History: As per HPI. OB History  Gravida Para Term Preterm AB Living  SAB TAB Ectopic Multiple Live Births        0 1    # Outcome Date GA Lbr Len/2nd Weight Sex Delivery Anes PTL Lv  2 Current           1 Term 02/16/15 [redacted]w[redacted]d / 00:30  M Vag-Spont EPI        Any issues with any prior pregnancies: no Any prior children are healthy, doing well, without any problems or issues: yes History of pap smears: Yes. Last pap smear 06/23/2014. NIL. History of STIs: No   Past Medical History: Past Medical History:  Diagnosis Date  . Hypothyroidism     Past Surgical History: Past Surgical History:  Procedure Laterality Date  . NO PAST SURGERIES      Family History:  History reviewed. No pertinent family history. She denies any female cancers, bleeding or blood clotting disorders.  She denies any history of intellectual disability, birth defects or genetic disorders in her or the FOB's history  Social History:  Social History   Social History  . Marital status: Married    Spouse  name: N/A  . Number of children: N/A  . Years of education: N/A   Occupational History  . Not on file.   Social History Main Topics  . Smoking status: Never Smoker  . Smokeless tobacco: Never Used  . Alcohol use No  . Drug use: No  . Sexual activity: Yes    Birth control/ protection: None   Other Topics Concern  . Not on file   Social History Narrative  . No narrative on file   Any cats in the household: no Denies history of or current domestic violence.  Allergy: Allergies  Allergen Reactions  . Kiwi Extract   . Pineapple     Current Outpatient Medications: No current outpatient prescriptions on file.   Physical Exam:   BP 126/70   Pulse (!) 108   Wt 222 lb (100.7 kg)   LMP 11/12/2016 (Exact Date)   BMI 40.60 kg/m  Body mass index is 40.6 kg/m. Constitutional: Well nourished, well developed female in no acute distress.  Neck:  Supple, normal appearance, and no thyromegaly  Cardiovascular: S1, S2 normal, no murmur, rub or gallop, regular rate and rhythm Respiratory:  Clear to auscultation bilateral. Normal respiratory effort Abdomen: positive bowel sounds and no masses, hernias; diffusely non tender to palpation, non distended Breasts: breasts appear normal, no suspicious masses, no skin or nipple changes or axillary nodes. Neuro/Psych:  Normal mood and affect.  Skin:  Warm and dry.  Lymphatic:  No inguinal lymphadenopathy.   Pelvic exam: is not limited by body habitus External genitalia, Bartholin's glands, Urethra, Skene's glands: within normal limits Vagina: within normal limits and with no blood in the vault  Cervix: normal appearing cervix without discharge or lesions, closed/long/high Uterus:  enlarged: c/w pregnancy, normal contour Adnexa:  no mass, fullness, tenderness  Assessment: Ms. Camaj is a 26 y.o. [name] [redacted]w[redacted]d based on Patient's last menstrual period was 11/12/2016 (exact date). with an Estimated Date of Delivery: 08/19/17, presenting for  prenatal care.  Plan:  1) Avoid alcoholic beverages. 2) Patient encouraged not to smoke.  3) Discontinue the use of all non-medicinal drugs and chemicals.  4) Take prenatal vitamins daily.  5) Seatbelt use advised 6) Nutrition, food safety (fish, cheese advisories, and high nitrite foods) and exercise discussed. 7) Hospital and practice style delivering at [facility] discussed  8) Patient is asked about travel to areas at risk for the Zika virus, and counseled to avoid travel and exposure to mosquitoes or sexual partners who may have themselves been exposed to the virus. Testing is discussed, and patient does not desire testing.  9) Childbirth classes at [facility] advised 10) Genetic Screening, such as with 1st Trimester Screening, cell free fetal DNA, AFP testing, and Ultrasound, as well as with amniocentesis and CVS as appropriate, is discussed with patient. She plans to decline genetic testing this pregnancy.  Problem list reviewed and updated.   Does not desire medication for nausea at this time. [place] discussed and patient will send a MyChart message if needed.  Unable to hear FHT by doppler, ultrasound ordered for dating/viability. Will return for labs and early GTT at next visit.  Return in about 1 week (around 03/16/2017) for ROB following ultrasound.  Marcelyn Bruins, CNM [facility], [facility] 03/09/2017  4:52 PM

## 2017-03-09 NOTE — Progress Notes (Signed)
Late entry to care NOB nausea

## 2017-03-11 LAB — URINE CULTURE

## 2017-03-11 LAB — GC/CHLAMYDIA PROBE AMP
CHLAMYDIA, DNA PROBE: NEGATIVE
Neisseria gonorrhoeae by PCR: NEGATIVE

## 2017-03-13 ENCOUNTER — Telehealth: Payer: Self-pay | Admitting: Maternal Newborn

## 2017-03-16 ENCOUNTER — Ambulatory Visit (INDEPENDENT_AMBULATORY_CARE_PROVIDER_SITE_OTHER): Payer: BC Managed Care – PPO | Admitting: Maternal Newborn

## 2017-03-16 ENCOUNTER — Other Ambulatory Visit: Payer: Self-pay | Admitting: Maternal Newborn

## 2017-03-16 ENCOUNTER — Other Ambulatory Visit: Payer: BC Managed Care – PPO

## 2017-03-16 ENCOUNTER — Ambulatory Visit (INDEPENDENT_AMBULATORY_CARE_PROVIDER_SITE_OTHER): Payer: BC Managed Care – PPO

## 2017-03-16 VITALS — BP 120/70 | Wt 220.0 lb

## 2017-03-16 DIAGNOSIS — Z3480 Encounter for supervision of other normal pregnancy, unspecified trimester: Secondary | ICD-10-CM | POA: Diagnosis not present

## 2017-03-16 DIAGNOSIS — O0991 Supervision of high risk pregnancy, unspecified, first trimester: Secondary | ICD-10-CM

## 2017-03-16 DIAGNOSIS — Z362 Encounter for other antenatal screening follow-up: Secondary | ICD-10-CM

## 2017-03-16 DIAGNOSIS — O99211 Obesity complicating pregnancy, first trimester: Secondary | ICD-10-CM

## 2017-03-16 DIAGNOSIS — R8271 Bacteriuria: Secondary | ICD-10-CM

## 2017-03-16 DIAGNOSIS — O99891 Other specified diseases and conditions complicating pregnancy: Secondary | ICD-10-CM

## 2017-03-16 DIAGNOSIS — O9989 Other specified diseases and conditions complicating pregnancy, childbirth and the puerperium: Secondary | ICD-10-CM

## 2017-03-16 DIAGNOSIS — Z6841 Body Mass Index (BMI) 40.0 and over, adult: Secondary | ICD-10-CM | POA: Insufficient documentation

## 2017-03-16 DIAGNOSIS — O99210 Obesity complicating pregnancy, unspecified trimester: Secondary | ICD-10-CM | POA: Insufficient documentation

## 2017-03-16 MED ORDER — CEPHALEXIN 500 MG PO CAPS: 500.0000 mg | ORAL_CAPSULE | Freq: Two times a day (BID) | ORAL | 0 refills | Status: AC

## 2017-03-16 NOTE — Progress Notes (Signed)
    Routine Prenatal Care Visit  Subjective  Carla [name] is a 26 y.o. [name] at [redacted]w[redacted]d being seen today for ongoing prenatal care.  She is currently monitored for the following issues for this high-risk pregnancy and has Supervision of high risk pregnancy, antepartum, first trimester; Obesity affecting pregnancy, antepartum, first trimester; and BMI 40.0-44.9, adult (HCC) on her problem list. ----------------------------------------------------------------------------------- Patient reports that nausea is improving, has only had a couple of nights this week.   Vag. Bleeding: None.  Denies leaking of fluid.  ----------------------------------------------------------------------------------- The following portions of the patient's history were reviewed and updated as appropriate: allergies, current medications, past family history, past medical history, past social history, past surgical history and problem list. Problem list updated.   Objective  Blood pressure 120/70, weight 220 lb (99.8 kg), last menstrual period 11/12/2016. Pregravid weight 220 lb (99.8 kg) Total Weight Gain 0 lb (0 kg) Urinalysis: Urine Protein: Negative Urine Glucose: Negative  Fetal Status: Fetal Heart Rate (bpm): 128         General:  Alert, oriented and cooperative. Patient is in no acute distress.  Skin: Skin is warm and dry. No rash noted.   Cardiovascular: Normal heart rate noted.  Respiratory: Normal respiratory effort, no problems with respiration noted.  Abdomen: Soft, gravid, appropriate for gestational age. Pain/Pressure: Absent     Pelvic:  Cervical exam deferred        Extremities: Normal range of motion.  Edema: None  Mental Status: Normal mood and affect. Normal behavior. Normal judgment and thought content.     Assessment   26 y.o. [name] at [redacted]w[redacted]d by  08/19/2017, by Last Menstrual Period presenting for routine prenatal visit.  Plan   Patient Active Problem List   Diagnosis Date Noted  .  Obesity affecting pregnancy, antepartum, first trimester 03/16/2017  . BMI 40.0-44.9, adult (HCC) 03/16/2017  . Supervision of high risk pregnancy, antepartum, first trimester 03/09/2017   Labs and early GTT today. Asymptomatic bacteriuria found on urine culture (P. mirabilis), advised patient and ordered Keflex.  Preterm labor symptoms and general obstetric precautions including but not limited to vaginal bleeding, contractions, leaking of fluid and fetal movement were reviewed in detail with the patient.  Please refer to After Visit Summary for other counseling recommendations.   Return in about 4 weeks (around 04/13/2017) for ROB.  Marcelyn Bruins, CNM 03/16/2017  11:50 AM

## 2017-03-16 NOTE — Patient Instructions (Signed)
First Trimester of Pregnancy The first trimester of pregnancy is from week 1 until the end of week 13 (months 1 through 3). A week after a sperm fertilizes an egg, the egg will implant on the wall of the uterus. This embryo will begin to develop into a baby. Genes from you and your partner will form the baby. The female genes will determine whether the baby will be a boy or a girl. At 6-8 weeks, the eyes and face will be formed, and the heartbeat can be seen on ultrasound. At the end of 12 weeks, all the baby's organs will be formed. Now that you are pregnant, you will want to do everything you can to have a healthy baby. Two of the most important things are to get good prenatal care and to follow your health care provider's instructions. Prenatal care is all the medical care you receive before the baby's birth. This care will help prevent, find, and treat any problems during the pregnancy and childbirth. Body changes during your first trimester Your body goes through many changes during pregnancy. The changes vary from woman to woman.  You may gain or lose a couple of pounds at first.  You may feel sick to your stomach (nauseous) and you may throw up (vomit). If the vomiting is uncontrollable, call your health care provider.  You may tire easily.  You may develop headaches that can be relieved by medicines. All medicines should be approved by your health care provider.  You may urinate more often. Painful urination may mean you have a bladder infection.  You may develop heartburn as a result of your pregnancy.  You may develop constipation because certain hormones are causing the muscles that push stool through your intestines to slow down.  You may develop hemorrhoids or swollen veins (varicose veins).  Your breasts may begin to grow larger and become tender. Your nipples may stick out more, and the tissue that surrounds them (areola) may become darker.  Your gums may bleed and may be  sensitive to brushing and flossing.  Dark spots or blotches (chloasma, mask of pregnancy) may develop on your face. This will likely fade after the baby is born.  Your menstrual periods will stop.  You may have a loss of appetite.  You may develop cravings for certain kinds of food.  You may have changes in your emotions from day to day, such as being excited to be pregnant or being concerned that something may go wrong with the pregnancy and baby.  You may have more vivid and strange dreams.  You may have changes in your hair. These can include thickening of your hair, rapid growth, and changes in texture. Some women also have hair loss during or after pregnancy, or hair that feels dry or thin. Your hair will most likely return to normal after your baby is born.  What to expect at prenatal visits During a routine prenatal visit:  You will be weighed to make sure you and the baby are growing normally.  Your blood pressure will be taken.  Your abdomen will be measured to track your baby's growth.  The fetal heartbeat will be listened to between weeks 10 and 14 of your pregnancy.  Test results from any previous visits will be discussed.  Your health care provider may ask you:  How you are feeling.  If you are feeling the baby move.  If you have had any abnormal symptoms, such as leaking fluid, bleeding, severe headaches,   or abdominal cramping.  If you are using any tobacco products, including cigarettes, chewing tobacco, and electronic cigarettes.  If you have any questions.  Other tests that may be performed during your first trimester include:  Blood tests to find your blood type and to check for the presence of any previous infections. The tests will also be used to check for low iron levels (anemia) and protein on red blood cells (Rh antibodies). Depending on your risk factors, or if you previously had diabetes during pregnancy, you may have tests to check for high blood  sugar that affects pregnant women (gestational diabetes).  Urine tests to check for infections, diabetes, or protein in the urine.  An ultrasound to confirm the proper growth and development of the baby.  Fetal screens for spinal cord problems (spina bifida) and Down syndrome.  HIV (human immunodeficiency virus) testing. Routine prenatal testing includes screening for HIV, unless you choose not to have this test.  You may need other tests to make sure you and the baby are doing well.  Follow these instructions at home: Medicines  Follow your health care provider's instructions regarding medicine use. Specific medicines may be either safe or unsafe to take during pregnancy.  Take a prenatal vitamin that contains at least 600 micrograms (mcg) of folic acid.  If you develop constipation, try taking a stool softener if your health care provider approves. Eating and drinking  Eat a balanced diet that includes fresh fruits and vegetables, whole grains, good sources of protein such as meat, eggs, or tofu, and low-fat dairy. Your health care provider will help you determine the amount of weight gain that is right for you.  Avoid raw meat and uncooked cheese. These carry germs that can cause birth defects in the baby.  Eating four or five small meals rather than three large meals a day may help relieve nausea and vomiting. If you start to feel nauseous, eating a few soda crackers can be helpful. Drinking liquids between meals, instead of during meals, also seems to help ease nausea and vomiting.  Limit foods that are high in fat and processed sugars, such as fried and sweet foods.  To prevent constipation: ? Eat foods that are high in fiber, such as fresh fruits and vegetables, whole grains, and beans. ? Drink enough fluid to keep your urine clear or pale yellow. Activity  Exercise only as directed by your health care provider. Most women can continue their usual exercise routine during  pregnancy. Try to exercise for 30 minutes at least 5 days a week. Exercising will help you: ? Control your weight. ? Stay in shape. ? Be prepared for labor and delivery.  Experiencing pain or cramping in the lower abdomen or lower back is a good sign that you should stop exercising. Check with your health care provider before continuing with normal exercises.  Try to avoid standing for long periods of time. Move your legs often if you must stand in one place for a long time.  Avoid heavy lifting.  Wear low-heeled shoes and practice good posture.  You may continue to have sex unless your health care provider tells you not to. Relieving pain and discomfort  Wear a good support bra to relieve breast tenderness.  Take warm sitz baths to soothe any pain or discomfort caused by hemorrhoids. Use hemorrhoid cream if your health care provider approves.  Rest with your legs elevated if you have leg cramps or low back pain.  If you develop   varicose veins in your legs, wear support hose. Elevate your feet for 15 minutes, 3-4 times a day. Limit salt in your diet. Prenatal care  Schedule your prenatal visits by the twelfth week of pregnancy. They are usually scheduled monthly at first, then more often in the last 2 months before delivery.  Write down your questions. Take them to your prenatal visits.  Keep all your prenatal visits as told by your health care provider. This is important. Safety  Wear your seat belt at all times when driving.  Make a list of emergency phone numbers, including numbers for family, friends, the hospital, and police and fire departments. General instructions  Ask your health care provider for a referral to a local prenatal education class. Begin classes no later than the beginning of month 6 of your pregnancy.  Ask for help if you have counseling or nutritional needs during pregnancy. Your health care provider can offer advice or refer you to specialists for help  with various needs.  Do not use hot tubs, steam rooms, or saunas.  Do not douche or use tampons or scented sanitary pads.  Do not cross your legs for long periods of time.  Avoid cat litter boxes and soil used by cats. These carry germs that can cause birth defects in the baby and possibly loss of the fetus by miscarriage or stillbirth.  Avoid all smoking, herbs, alcohol, and medicines not prescribed by your health care provider. Chemicals in these products affect the formation and growth of the baby.  Do not use any products that contain nicotine or tobacco, such as cigarettes and e-cigarettes. If you need help quitting, ask your health care provider. You may receive counseling support and other resources to help you quit.  Schedule a dentist appointment. At home, brush your teeth with a soft toothbrush and be gentle when you floss. Contact a health care provider if:  You have dizziness.  You have mild pelvic cramps, pelvic pressure, or nagging pain in the abdominal area.  You have persistent nausea, vomiting, or diarrhea.  You have a bad smelling vaginal discharge.  You have pain when you urinate.  You notice increased swelling in your face, hands, legs, or ankles.  You are exposed to fifth disease or chickenpox.  You are exposed to German measles (rubella) and have never had it. Get help right away if:  You have a fever.  You are leaking fluid from your vagina.  You have spotting or bleeding from your vagina.  You have severe abdominal cramping or pain.  You have rapid weight gain or loss.  You vomit blood or material that looks like coffee grounds.  You develop a severe headache.  You have shortness of breath.  You have any kind of trauma, such as from a fall or a car accident. Summary  The first trimester of pregnancy is from week 1 until the end of week 13 (months 1 through 3).  Your body goes through many changes during pregnancy. The changes vary from  woman to woman.  You will have routine prenatal visits. During those visits, your health care provider will examine you, discuss any test results you may have, and talk with you about how you are feeling. This information is not intended to replace advice given to you by your health care provider. Make sure you discuss any questions you have with your health care provider. Document Released: 05/17/2001 Document Revised: 05/04/2016 Document Reviewed: 05/04/2016 Elsevier Interactive Patient Education  2017 [employer]   Inc[employer]  

## 2017-03-16 NOTE — Telephone Encounter (Signed)
Will see patient today and discuss lab results from last visit. Carla [name], CNM 03/16/2017  11:22 AM

## 2017-03-17 ENCOUNTER — Encounter: Payer: Self-pay | Admitting: Maternal Newborn

## 2017-03-17 LAB — RPR+RH+ABO+RUB AB+AB SCR+CB...
ANTIBODY SCREEN: NEGATIVE
HEMOGLOBIN: 13 g/dL (ref 11.1–15.9)
HIV Screen 4th Generation wRfx: NONREACTIVE
Hematocrit: 40.4 % (ref 34.0–46.6)
Hepatitis B Surface Ag: NEGATIVE
MCH: 28.5 pg (ref 26.6–33.0)
MCHC: 32.2 g/dL (ref 31.5–35.7)
MCV: 89 fL (ref 79–97)
Platelets: 330 10*3/uL (ref 150–379)
RBC: 4.56 x10E6/uL (ref 3.77–5.28)
RDW: 13.5 % (ref 12.3–15.4)
RH TYPE: POSITIVE
RPR Ser Ql: NONREACTIVE
RUBELLA: 13.8 {index} (ref >0.99)
Varicella zoster IgG: <135 index — ABNORMAL LOW (ref >165)
WBC: 9.8 10*3/uL (ref 3.4–10.8)

## 2017-03-17 LAB — GLUCOSE, 1 HOUR GESTATIONAL: Gestational Diabetes Screen: 129 mg/dL (ref 65–139)

## 2017-03-17 LAB — TSH: TSH: 3.02 u[IU]/mL (ref 0.450–4.500)

## 2017-04-13 ENCOUNTER — Encounter: Payer: Self-pay | Admitting: Obstetrics and Gynecology

## 2017-04-13 ENCOUNTER — Ambulatory Visit (INDEPENDENT_AMBULATORY_CARE_PROVIDER_SITE_OTHER): Payer: BC Managed Care – PPO | Admitting: Obstetrics and Gynecology

## 2017-04-13 VITALS — BP 122/74 | Wt 228.0 lb

## 2017-04-13 DIAGNOSIS — O99211 Obesity complicating pregnancy, first trimester: Secondary | ICD-10-CM

## 2017-04-13 DIAGNOSIS — Z3A10 10 weeks gestation of pregnancy: Secondary | ICD-10-CM

## 2017-04-13 DIAGNOSIS — O99280 Endocrine, nutritional and metabolic diseases complicating pregnancy, unspecified trimester: Secondary | ICD-10-CM

## 2017-04-13 DIAGNOSIS — Z6841 Body Mass Index (BMI) 40.0 and over, adult: Secondary | ICD-10-CM

## 2017-04-13 DIAGNOSIS — O0991 Supervision of high risk pregnancy, unspecified, first trimester: Secondary | ICD-10-CM

## 2017-04-13 DIAGNOSIS — E039 Hypothyroidism, unspecified: Secondary | ICD-10-CM | POA: Insufficient documentation

## 2017-04-13 MED ORDER — LEVOTHYROXINE SODIUM 50 MCG PO CAPS: 50.0000 ug | ORAL_CAPSULE | Freq: Every day | ORAL | 1 refills | Status: DC

## 2017-04-13 NOTE — Progress Notes (Signed)
Routine Prenatal Care Visit  Subjective  Carla [name] is a 26 y.o. [name] at [redacted]w[redacted]d being seen today for ongoing prenatal care.  She is currently monitored for the following issues for this high-risk pregnancy and has Supervision of high risk pregnancy, antepartum, first trimester; Obesity affecting pregnancy, antepartum, first trimester; BMI 40.0-44.9, adult (HCC); and Hypothyroid in pregnancy, antepartum on their problem list.  ----------------------------------------------------------------------------------- Patient reports no complaints.    . Vag. Bleeding: None.  Movement: Absent. Denies leaking of fluid.  ----------------------------------------------------------------------------------- The following portions of the patient's history were reviewed and updated as appropriate: allergies, current medications, past family history, past medical history, past social history, past surgical history and problem list. Problem list updated.   Objective  Blood pressure 122/74, weight 228 lb (103.4 kg), last menstrual period 11/12/2016. Pregravid weight 220 lb (99.8 kg) Total Weight Gain 8 lb (3.629 kg) Urinalysis: Urine Protein: Negative Urine Glucose: Negative  Fetal Status: Fetal Heart Rate (bpm): 170   Movement: Absent     General:  Alert, oriented and cooperative. Patient is in no acute distress.  Skin: Skin is warm and dry. No rash noted.   Cardiovascular: Normal heart rate noted  Respiratory: Normal respiratory effort, no problems with respiration noted  Abdomen: Soft, gravid, appropriate for gestational age. Pain/Pressure: Absent     Pelvic:  Cervical exam deferred        Extremities: Normal range of motion.  Edema: None  Mental Status: Normal mood and affect. Normal behavior. Normal judgment and thought content.   Assessment   26 y.o. [name] at [redacted]w[redacted]d by  11/08/2017, by Ultrasound presenting for routine prenatal visit  Plan   Pregnancy#2 Problems (from 11/12/16 to present)    Problem Noted Resolved   Hypothyroid in pregnancy, antepartum 04/13/2017 by Conard Novak, MD No   Obesity affecting pregnancy, antepartum, first trimester 03/16/2017 by Oswaldo Conroy, CNM No   BMI 40.0-44.9, adult (HCC) 03/16/2017 by Oswaldo Conroy, CNM No   Supervision of high risk pregnancy, antepartum, first trimester 03/09/2017 by Oswaldo Conroy, CNM No   Overview Addendum 03/17/2017  9:41 AM by Oswaldo Conroy, CNM    [facility] Prenatal Labs  Dating 6 week [place] Blood type: O Positive     Genetic Screen 1 Screen:    AFP:     Quad:     NIPS: Antibody:   Anatomic [place]  Rubella: Immune   Varicella:  Non-immune  GTT Early: 129    Third trimester:  RPR:   NR  Rhogam  HBsAg:   Neg  TDaP vaccine                       Flu Shot: HIV:   NR  Baby Food                                GBS:   Contraception  Pap:  CBB     CS/VBAC    Support Person              Obesity (BMI 30-39.9) 03/09/2017 by Oswaldo Conroy, CNM 03/16/2017 by Oswaldo Conroy, CNM    Start levothyroxine today. History of hypothyroidism. NOt taking medication. Last TSH was 3.02.  Start 50 mcg. Recheck in 1 month. Declines genetic screening again today.    Please refer to After Visit Summary for other counseling recommendations.   Return in about 4 weeks (  around 05/11/2017) for Routine Prenatal Appointment.  Thomasene MohairStephen Mahreen Schewe, MD  04/13/2017 3:31 PM

## 2017-04-18 ENCOUNTER — Telehealth: Payer: Self-pay

## 2017-04-18 NOTE — Telephone Encounter (Signed)
Tylenol and she can take magnesium supplements if she goes under the headache care isle in the pharmacy.  A serving of caffeine a day is fine as well and may help

## 2017-04-18 NOTE — Telephone Encounter (Signed)
Pt aware.

## 2017-04-18 NOTE — Telephone Encounter (Signed)
Please advise 

## 2017-04-18 NOTE — Telephone Encounter (Signed)
Pt states she is [redacted] wk pregnant & is on Levothyroxine. She has had a migraine since Friday a.m. Pt inquiring what she can take. ZO#[phone]#[phone].

## 2017-05-15 ENCOUNTER — Encounter: Payer: BC Managed Care – PPO | Admitting: Obstetrics and Gynecology

## 2017-05-17 ENCOUNTER — Encounter: Payer: BC Managed Care – PPO | Admitting: Obstetrics and Gynecology

## 2017-05-17 ENCOUNTER — Encounter: Payer: Self-pay | Admitting: Obstetrics and Gynecology

## 2017-05-17 ENCOUNTER — Ambulatory Visit (INDEPENDENT_AMBULATORY_CARE_PROVIDER_SITE_OTHER): Payer: BC Managed Care – PPO | Admitting: Obstetrics and Gynecology

## 2017-05-17 VITALS — BP 122/78 | Wt 228.0 lb

## 2017-05-17 DIAGNOSIS — Z6841 Body Mass Index (BMI) 40.0 and over, adult: Secondary | ICD-10-CM

## 2017-05-17 DIAGNOSIS — O99280 Endocrine, nutritional and metabolic diseases complicating pregnancy, unspecified trimester: Secondary | ICD-10-CM

## 2017-05-17 DIAGNOSIS — Z3A15 15 weeks gestation of pregnancy: Secondary | ICD-10-CM

## 2017-05-17 DIAGNOSIS — O0991 Supervision of high risk pregnancy, unspecified, first trimester: Secondary | ICD-10-CM

## 2017-05-17 DIAGNOSIS — O99210 Obesity complicating pregnancy, unspecified trimester: Secondary | ICD-10-CM

## 2017-05-17 DIAGNOSIS — O0990 Supervision of high risk pregnancy, unspecified, unspecified trimester: Secondary | ICD-10-CM

## 2017-05-17 DIAGNOSIS — E039 Hypothyroidism, unspecified: Secondary | ICD-10-CM

## 2017-05-17 NOTE — Progress Notes (Signed)
Routine Prenatal Care Visit Subjective  Carla [name] is a 26 y.o. [name] at [redacted]w[redacted]d being seen today for ongoing prenatal care.  She is currently monitored for the following issues for this high-risk pregnancy and has Supervision of high risk pregnancy, antepartum; Obesity affecting pregnancy, antepartum; BMI 40.0-44.9, adult (HCC); and Hypothyroid in pregnancy, antepartum on their problem list.  ----------------------------------------------------------------------------------- Patient reports headache.   Still with migraines. Has only tried tylenol. No visual changes. No focal or generalized neurologic signs.  . Vag. Bleeding: None.  Movement: Present. Denies leaking of fluid.  ----------------------------------------------------------------------------------- The following portions of the patient's history were reviewed and updated as appropriate: allergies, current medications, past family history, past medical history, past social history, past surgical history and problem list. Problem list updated. Objective  Blood pressure 122/78, weight 228 lb (103.4 kg), last menstrual period 11/12/2016. Pregravid weight 220 lb (99.8 kg) Total Weight Gain 8 lb (3.629 kg) Urinalysis: Urine Protein: Negative Urine Glucose: Negative  Fetal Status: Fetal Heart Rate (bpm): 145   Movement: Present     General:  Alert, oriented and cooperative. Patient is in no acute distress.  Skin: Skin is warm and dry. No rash noted.   Cardiovascular: Normal heart rate noted  Respiratory: Normal respiratory effort, no problems with respiration noted  Abdomen: Soft, gravid, appropriate for gestational age. Pain/Pressure: Absent     Pelvic:  Cervical exam deferred        Extremities: Normal range of motion.     Mental Status: Normal mood and affect. Normal behavior. Normal judgment and thought content.   Assessment   26 y.o. [name] at [redacted]w[redacted]d by  11/08/2017, by Ultrasound presenting for routine prenatal visit  Plan    Pregnancy#2 Problems (from 11/12/16 to present)    Problem Noted Resolved   Hypothyroid in pregnancy, antepartum 04/13/2017 by Conard Novak, MD No   Overview Signed 05/17/2017 10:40 AM by Conard Novak, MD    - initial TSH >3.0, started levothyroxine 50 mcg - recheck, TSH at 12/12 appt. [ ]  awaiting result       Obesity affecting pregnancy, antepartum, first trimester 03/16/2017 by Oswaldo Conroy, CNM No   BMI 40.0-44.9, adult (HCC) 03/16/2017 by Oswaldo Conroy, CNM No   Overview Signed 05/17/2017 10:41 AM by Conard Novak, MD    BMI >=40 [x]  early 1h gtt -  [x]  u/s for dating [x]   [x]  nutritional goals [x]  folic acid 1mg  [x]  bASA (>12 weeks) [x]  consider nutrition consult [x]  consider maternal EKG 1st trimester [ ]  Growth u/s 28 [ ] , 32 [ ] , 36 weeks [ ]  [ ]  NST/AFI weekly 36+ weeks (36[] , 37[] , 38[] , 39[] , 40[] ) [ ]  IOL by 41 weeks (scheduled, prn [] )     Supervision of high risk pregnancy, antepartum, first trimester 03/09/2017 by Oswaldo Conroy, CNM No   Overview Addendum 05/17/2017 10:41 AM by Conard Novak, MD    [facility] Prenatal Labs  Dating 6 week [place] Blood type: O Positive O/Positive/-- (10/11 1021)   Genetic Screen Declines all Antibody:Negative (10/11 1021)  Anatomic [place]  Rubella: Immune 13.80 (10/11 1021) Varicella:  Non-immune  GTT Early: 129    Third trimester:  RPR: Non Reactive (10/11 1021) NR  Rhogam  HBsAg: Negative (10/11 1021) Neg  TDaP vaccine                       Flu Shot: HIV:   NR  Baby Food  GBS:   Contraception  Pap:  CBB     Support Person         Obesity (BMI 30-39.9) 03/09/2017 by Oswaldo ConroySchmid, Jacelyn Y, CNM 03/16/2017 by Oswaldo ConroySchmid, Jacelyn Y, CNM     Please refer to After Visit Summary for other counseling recommendations.   Return in about 4 weeks (around 06/14/2017) for schedule anatomy ultrasund and routine prenatal.  -check TSH today for thyroid medication - declines 2nd trim  screen and msAFP - anatomy u/s next visit - try magnesium oxide daily for headache suppression  Thomasene MohairStephen Saqib Cazarez, MD  05/17/2017 10:44 AM

## 2017-05-18 LAB — TSH+FREE T4
Free T4: 1.05 ng/dL (ref 0.82–1.77)
TSH: 1.22 u[IU]/mL (ref 0.450–4.500)

## 2017-06-02 ENCOUNTER — Encounter: Payer: Self-pay | Admitting: Obstetrics and Gynecology

## 2017-06-02 ENCOUNTER — Other Ambulatory Visit: Payer: Self-pay | Admitting: Obstetrics and Gynecology

## 2017-06-02 DIAGNOSIS — O0991 Supervision of high risk pregnancy, unspecified, first trimester: Secondary | ICD-10-CM

## 2017-06-02 DIAGNOSIS — O99280 Endocrine, nutritional and metabolic diseases complicating pregnancy, unspecified trimester: Principal | ICD-10-CM

## 2017-06-02 DIAGNOSIS — E039 Hypothyroidism, unspecified: Secondary | ICD-10-CM

## 2017-06-02 MED ORDER — LEVOTHYROXINE SODIUM 50 MCG PO CAPS: 50.0000 ug | ORAL_CAPSULE | Freq: Every day | ORAL | 3 refills | Status: DC

## 2017-06-06 NOTE — L&D Delivery Note (Signed)
Delivery Note At 11:56 AM a viable female was delivered via Vaginal, Spontaneous (Presentation: ROA).  APGAR: 8, 9; weight pending.   Placenta status: delivered spontaneously, intact.  Cord: 3VC with the following complications: none.  Cord pH: n/a  Anesthesia:  none Episiotomy: None Lacerations: 2nd degree Suture Repair: 3.0 vicryl Est. Blood Loss (mL): 350  Mom to postpartum.  Baby to Couplet care / Skin to Skin.  Called to see patient.  Mom pushed to deliver a viable female infant.  The head followed by shoulders, which delivered without difficulty, and the rest of the body.  No nuchal cord noted.  Baby to mom's chest.  Cord clamped and cut after > 1 min delay.  Cord blood obtained.  Placenta delivered spontaneously, intact, with a 3-vessel cord.  Second degree perineal laceration repaired with 3-0 Vicryl in standard fashion.  All counts correct.  Hemostasis obtained with IV pitocin and fundal massage. EBL 350 mL.     [name], MD 10/30/2017, 12:25 PM

## 2017-06-14 ENCOUNTER — Encounter: Payer: Self-pay | Admitting: Advanced Practice Midwife

## 2017-06-14 ENCOUNTER — Ambulatory Visit (INDEPENDENT_AMBULATORY_CARE_PROVIDER_SITE_OTHER): Payer: BC Managed Care – PPO | Admitting: Advanced Practice Midwife

## 2017-06-14 ENCOUNTER — Ambulatory Visit (INDEPENDENT_AMBULATORY_CARE_PROVIDER_SITE_OTHER): Payer: BC Managed Care – PPO

## 2017-06-14 VITALS — BP 127/84 | Wt 238.0 lb

## 2017-06-14 DIAGNOSIS — O0991 Supervision of high risk pregnancy, unspecified, first trimester: Secondary | ICD-10-CM

## 2017-06-14 DIAGNOSIS — Z3A19 19 weeks gestation of pregnancy: Secondary | ICD-10-CM

## 2017-06-14 DIAGNOSIS — Z362 Encounter for other antenatal screening follow-up: Secondary | ICD-10-CM | POA: Diagnosis not present

## 2017-06-14 NOTE — Progress Notes (Signed)
Routine Prenatal Care Visit  Subjective  Carla [name] is a 27 y.o. [name] at [redacted]w[redacted]d being seen today for ongoing prenatal care.  She is currently monitored for the following issues for this high-risk pregnancy and has Supervision of high risk pregnancy, antepartum; Obesity affecting pregnancy, antepartum; BMI 40.0-44.9, adult (HCC); and Hypothyroid in pregnancy, antepartum on their problem list.  ----------------------------------------------------------------------------------- Patient reports some discomfort related to positioning of baby and sciatica pain.   Contractions: Not present. Vag. Bleeding: None.  Movement: Present. Denies leaking of fluid.  ----------------------------------------------------------------------------------- The following portions of the patient's history were reviewed and updated as appropriate: allergies, current medications, past family history, past medical history, past social history, past surgical history and problem list. Problem list updated.   Objective  Blood pressure 127/84, weight 238 lb (108 kg), last menstrual period 11/12/2016. Pregravid weight 220 lb (99.8 kg) Total Weight Gain 18 lb (8.165 kg) Urinalysis: Urine Protein: Negative Urine Glucose: Negative  Fetal Status: Fetal Heart Rate (bpm): 159   Movement: Present     Anatomy scan today is complete, marginal cord insertion  General:  Alert, oriented and cooperative. Patient is in no acute distress.  Skin: Skin is warm and dry. No rash noted.   Cardiovascular: Normal heart rate noted  Respiratory: Normal respiratory effort, no problems with respiration noted  Abdomen: Soft, gravid, appropriate for gestational age. Pain/Pressure: Absent     Pelvic:  Cervical exam deferred        Extremities: Normal range of motion.     Mental Status: Normal mood and affect. Normal behavior. Normal judgment and thought content.   Assessment   27 y.o. [name] at [redacted]w[redacted]d by  11/08/2017, by Ultrasound presenting  for routine prenatal visit  Plan   Pregnancy#2 Problems (from 11/12/16 to present)    Problem Noted Resolved   Hypothyroid in pregnancy, antepartum 04/13/2017 by Conard Novak, MD No   Overview Addendum 05/18/2017 10:45 AM by Conard Novak, MD    - initial TSH >3.0, started levothyroxine 50 mcg - recheck, TSH at 12/12 appt. - Normal [ ]  recheck with 28 week labs       Obesity affecting pregnancy, antepartum 03/16/2017 by Oswaldo Conroy, CNM No   BMI 40.0-44.9, adult (HCC) 03/16/2017 by Oswaldo Conroy, CNM No   Overview Signed 05/17/2017 10:41 AM by Conard Novak, MD    BMI >=40 [x]  early 1h gtt -  [x]  u/s for dating [x]   [x]  nutritional goals [x]  folic acid 1mg  [x]  bASA (>12 weeks) [x]  consider nutrition consult [x]  consider maternal EKG 1st trimester [ ]  Growth u/s 28 [ ] , 32 [ ] , 36 weeks [ ]  [ ]  NST/AFI weekly 36+ weeks (36[] , 37[] , 38[] , 39[] , 40[] ) [ ]  IOL by 41 weeks (scheduled, prn [] )       Supervision of high risk pregnancy, antepartum 03/09/2017 by Oswaldo Conroy, CNM No   Overview Addendum 05/17/2017 10:41 AM by Conard Novak, MD    [facility] Prenatal Labs  Dating 6 week [place] Blood type: O Positive O/Positive/-- (10/11 1021)   Genetic Screen Declines all Antibody:Negative (10/11 1021)  Anatomic [place]  Rubella: Immune 13.80 (10/11 1021) Varicella:  Non-immune  GTT Early: 129 Third trimester:  RPR: Non Reactive (10/11 1021) NR  Rhogam  HBsAg: Negative (10/11 1021) Neg  TDaP vaccine                       Flu Shot: HIV:   NR  Baby Food                                GBS:   Contraception  Pap:  CBB     Support Person          Obesity (BMI 30-39.9) 03/09/2017 by Oswaldo Conroy, CNM 03/16/2017 by Oswaldo Conroy, CNM       Preterm labor symptoms and general obstetric precautions including but not limited to vaginal bleeding, contractions, leaking of fluid and fetal movement were reviewed in detail with the patient.  Recommend  comfort measures for back pain and generally increase activity level, eat healthy foods   Return in about 4 weeks (around 07/12/2017) for rob.  Tresea Mall, CNM  06/14/2017 3:17 PM

## 2017-06-14 NOTE — Progress Notes (Signed)
Anatomy scan today. No vb. No lof.  ?

## 2017-06-15 ENCOUNTER — Other Ambulatory Visit: Payer: Self-pay | Admitting: Obstetrics & Gynecology

## 2017-06-15 NOTE — Progress Notes (Signed)
Review of ULTRASOUND.    I have personally reviewed images and report of recent ultrasound done at [facility].    Plan of management to be discussed with patient/CNM Kathaleen Bury, MD, [facility], [facility] 06/15/2017  10:44 AM

## 2017-06-23 ENCOUNTER — Encounter: Payer: Self-pay | Admitting: Obstetrics and Gynecology

## 2017-06-29 ENCOUNTER — Encounter: Payer: Self-pay | Admitting: Obstetrics and Gynecology

## 2017-06-30 ENCOUNTER — Ambulatory Visit (INDEPENDENT_AMBULATORY_CARE_PROVIDER_SITE_OTHER): Payer: BC Managed Care – PPO | Admitting: Maternal Newborn

## 2017-06-30 ENCOUNTER — Encounter: Payer: Self-pay | Admitting: Maternal Newborn

## 2017-06-30 VITALS — BP 130/76 | Wt 238.0 lb

## 2017-06-30 DIAGNOSIS — O0990 Supervision of high risk pregnancy, unspecified, unspecified trimester: Secondary | ICD-10-CM

## 2017-06-30 DIAGNOSIS — R109 Unspecified abdominal pain: Secondary | ICD-10-CM

## 2017-06-30 LAB — POCT URINALYSIS DIPSTICK
BILIRUBIN UA: NEGATIVE
GLUCOSE UA: NEGATIVE
Ketones, UA: NEGATIVE
Nitrite, UA: NEGATIVE
PH UA: 6.5 (ref 5.0–8.0)
RBC UA: NEGATIVE
Spec Grav, UA: 1.02 (ref 1.010–1.025)
UROBILINOGEN UA: NEGATIVE U/dL — AB

## 2017-06-30 NOTE — Progress Notes (Signed)
Prenatal Care Visit  - BP Check  Subjective  Carla [name] is a 27 y.o. [name] at [redacted]w[redacted]d being seen today for ongoing prenatal care.  She is currently monitored for the following issues for this high-risk pregnancy and has Supervision of high risk pregnancy, antepartum; Obesity affecting pregnancy, antepartum; BMI 40.0-44.9, adult (HCC); and Hypothyroid in pregnancy, antepartum on their problem list.  ----------------------------------------------------------------------------------- Patient reports improvement in tingling in her legs.  Still feeling some pressure, denies pain. Denies headaches, visual changes, epigastric pain. No edema. No dysuria, frequency, urgency or back pain. Contractions: Not present. Vag. Bleeding: None.  Movement: Present. Denies leaking of fluid.  ----------------------------------------------------------------------------------- The following portions of the patient's history were reviewed and updated as appropriate: allergies, current medications, past family history, past medical history, past social history, past surgical history and problem list. Problem list updated.   Objective  Blood pressure 130/76, weight 238 lb (108 kg), last menstrual period 11/12/2016. Pregravid weight 220 lb (99.8 kg) Total Weight Gain 18 lb (8.165 kg) Urinalysis: Urine Protein: Trace Urine Glucose: Negative  Fetal Status: Fetal Heart Rate (bpm): 153   Movement: Present     General:  Alert, oriented and cooperative. Patient is in no acute distress.  Skin: Skin is warm and dry. No rash noted.   Cardiovascular: Normal heart rate noted  Respiratory: Normal respiratory effort, no problems with respiration noted  Abdomen: Soft, gravid, appropriate for gestational age. Pain/Pressure: Present     Pelvic:  Cervical exam deferred        Extremities: Normal range of motion.  Edema: None  Mental Status: Normal mood and affect. Normal behavior. Normal judgment and thought content.    Trace leukocytes in UA.  Assessment   26 y.o. [name] at [redacted]w[redacted]d, EDD 11/08/2017, by Ultrasound presenting for work-in prenatal visit.  Plan   Pregnancy#2 Problems (from 11/12/16 to present)    Problem Noted Resolved   Hypothyroid in pregnancy, antepartum 04/13/2017 by Conard Novak, MD No   Overview Addendum 05/18/2017 10:45 AM by Conard Novak, MD    - initial TSH >3.0, started levothyroxine 50 mcg - recheck, TSH at 12/12 appt. - Normal [ ]  recheck with 28 week labs       Obesity affecting pregnancy, antepartum 03/16/2017 by Oswaldo Conroy, CNM No   BMI 40.0-44.9, adult (HCC) 03/16/2017 by Oswaldo Conroy, CNM No   Overview Signed 05/17/2017 10:41 AM by Conard Novak, MD    BMI >=40 [x]  early 1h gtt -  [x]  u/s for dating [x]   [x]  nutritional goals [x]  folic acid 1mg  [x]  bASA (>12 weeks) [x]  consider nutrition consult [x]  consider maternal EKG 1st trimester [ ]  Growth u/s 28 [ ] , 32 [ ] , 36 weeks [ ]  [ ]  NST/AFI weekly 36+ weeks (36[] , 37[] , 38[] , 39[] , 40[] ) [ ]  IOL by 41 weeks (scheduled, prn [] )       Supervision of high risk pregnancy, antepartum 03/09/2017 by Oswaldo Conroy, CNM No   Overview Addendum 05/17/2017 10:41 AM by Conard Novak, MD    [facility] Prenatal Labs  Dating 6 week [place] Blood type: O Positive O/Positive/-- (10/11 1021)   Genetic Screen Declines all Antibody:Negative (10/11 1021)  Anatomic [place]  Rubella: Immune 13.80 (10/11 1021) Varicella:  Non-immune  GTT Early: 129    Third trimester:  RPR: Non Reactive (10/11 1021) NR  Rhogam  HBsAg: Negative (10/11 1021) Neg  TDaP vaccine  Flu Shot: HIV:   NR  Baby Food                                GBS:   Contraception  Pap:  CBB     Support Person          Obesity (BMI 30-39.9) 03/09/2017 by Oswaldo Conroy, CNM 03/16/2017 by Oswaldo Conroy, CNM    Reviewed signs of preeclampsia and asked her to return to care if any develop. Tingling sensations  may be caused by sciatica. Discussed trace leukocytes, patient unable to provide urine for clean catch at this time but will call if any urinary symptoms occur before next appointment.  Preterm labor symptoms and general obstetric precautions including but not limited to vaginal bleeding, contractions, leaking of fluid and fetal movement were reviewed in detail with the patient.  Keep appointment on 07/13/2017.  Marcelyn Bruins, CNM 06/30/2017  9:24 AM

## 2017-06-30 NOTE — Progress Notes (Signed)
C/o pressure (see long dip), legs go numb and tingly.rj

## 2017-07-13 ENCOUNTER — Ambulatory Visit (INDEPENDENT_AMBULATORY_CARE_PROVIDER_SITE_OTHER): Payer: BC Managed Care – PPO | Admitting: Obstetrics and Gynecology

## 2017-07-13 ENCOUNTER — Encounter: Payer: Self-pay | Admitting: Obstetrics and Gynecology

## 2017-07-13 VITALS — BP 132/84 | Wt 242.0 lb

## 2017-07-13 DIAGNOSIS — Z113 Encounter for screening for infections with a predominantly sexual mode of transmission: Secondary | ICD-10-CM

## 2017-07-13 DIAGNOSIS — Z6841 Body Mass Index (BMI) 40.0 and over, adult: Secondary | ICD-10-CM

## 2017-07-13 DIAGNOSIS — O0990 Supervision of high risk pregnancy, unspecified, unspecified trimester: Secondary | ICD-10-CM

## 2017-07-13 DIAGNOSIS — Z3A23 23 weeks gestation of pregnancy: Secondary | ICD-10-CM

## 2017-07-13 DIAGNOSIS — E039 Hypothyroidism, unspecified: Secondary | ICD-10-CM

## 2017-07-13 DIAGNOSIS — O99280 Endocrine, nutritional and metabolic diseases complicating pregnancy, unspecified trimester: Secondary | ICD-10-CM

## 2017-07-13 DIAGNOSIS — Z131 Encounter for screening for diabetes mellitus: Secondary | ICD-10-CM

## 2017-07-13 DIAGNOSIS — O99210 Obesity complicating pregnancy, unspecified trimester: Secondary | ICD-10-CM

## 2017-07-13 NOTE — Progress Notes (Signed)
Routine Prenatal Care Visit  Subjective  Carla [name] is a 27 y.o. [name] at [redacted]w[redacted]d being seen today for ongoing prenatal care.  She is currently monitored for the following issues for this high-risk pregnancy and has Supervision of high risk pregnancy, antepartum; Obesity affecting pregnancy, antepartum; BMI 40.0-44.9, adult (HCC); and Hypothyroid in pregnancy, antepartum on their problem list.  ----------------------------------------------------------------------------------- Patient reports: mild pelvic pressure, unchanged from last visit.  No other symptoms. Contractions: Not present. Vag. Bleeding: None.  Movement: Present. Denies leaking of fluid.  ----------------------------------------------------------------------------------- The following portions of the patient's history were reviewed and updated as appropriate: allergies, current medications, past family history, past medical history, past social history, past surgical history and problem list. Problem list updated.  Objective  Blood pressure 132/84, weight 242 lb (109.8 kg), last menstrual period 11/12/2016. Pregravid weight 220 lb (99.8 kg) Total Weight Gain 22 lb (9.979 kg) Urinalysis: Urine Protein: Negative Urine Glucose: Negative  Fetal Status: Fetal Heart Rate (bpm): 145   Movement: Present     General:  Alert, oriented and cooperative. Patient is in no acute distress.  Skin: Skin is warm and dry. No rash noted.   Cardiovascular: Normal heart rate noted  Respiratory: Normal respiratory effort, no problems with respiration noted  Abdomen: Soft, gravid, appropriate for gestational age. Pain/Pressure: Present     Pelvic:  Cervical exam deferred        Extremities: Normal range of motion.  Edema: None  Mental Status: Normal mood and affect. Normal behavior. Normal judgment and thought content.   Assessment   27 y.o. [name] at [redacted]w[redacted]d by  11/08/2017, by Ultrasound presenting for routine prenatal visit  Plan    Pregnancy#2 Problems (from 11/12/16 to present)    Problem Noted Resolved   Hypothyroid in pregnancy, antepartum 04/13/2017 by Conard Novak, MD No   Overview Addendum 05/18/2017 10:45 AM by Conard Novak, MD    - initial TSH >3.0, started levothyroxine 50 mcg - recheck, TSH at 12/12 appt. - Normal [ ]  recheck with 28 week labs       Obesity affecting pregnancy, antepartum 03/16/2017 by Oswaldo Conroy, CNM No   BMI 40.0-44.9, adult (HCC) 03/16/2017 by Oswaldo Conroy, CNM No   Overview Signed 05/17/2017 10:41 AM by Conard Novak, MD    BMI >=40 [x]  early 1h gtt -  [x]  u/s for dating [x]   [x]  nutritional goals [x]  folic acid 1mg  [x]  bASA (>12 weeks) [x]  consider nutrition consult [x]  consider maternal EKG 1st trimester [ ]  Growth u/s 28 [ ] , 32 [ ] , 36 weeks [ ]  [ ]  NST/AFI weekly 36+ weeks (36[] , 37[] , 38[] , 39[] , 40[] ) [ ]  IOL by 41 weeks (scheduled, prn [] )       Supervision of high risk pregnancy, antepartum 03/09/2017 by Oswaldo Conroy, CNM No   Overview Addendum 05/17/2017 10:41 AM by Conard Novak, MD    [facility] Prenatal Labs  Dating 6 week [place] Blood type: O Positive O/Positive/-- (10/11 1021)   Genetic Screen Declines all Antibody:Negative (10/11 1021)  Anatomic [place]  Rubella: Immune 13.80 (10/11 1021) Varicella:  Non-immune  GTT Early: 129    Third trimester:  RPR: Non Reactive (10/11 1021) NR  Rhogam  HBsAg: Negative (10/11 1021) Neg  TDaP vaccine                       Flu Shot: HIV:   NR  Baby Food  GBS:   Contraception  Pap:  CBB     Support Person          Obesity (BMI 30-39.9) 03/09/2017 by Oswaldo Conroy, CNM 03/16/2017 by Oswaldo Conroy, CNM      Preterm labor symptoms and general obstetric precautions including but not limited to vaginal bleeding, contractions, leaking of fluid and fetal movement were reviewed in detail with the patient. Please refer to After Visit Summary for other  counseling recommendations.  -Growth u/s next visit - 28 week labs next visit - TSH/FT4 next visit - Precautions given regarding pelvic pressure  Return in about 4 weeks (around 08/10/2017) for schedule growth u/s, schedule 28 week labs, and routine prenatal.  [name], MD  07/13/2017 2:50 PM

## 2017-08-10 ENCOUNTER — Ambulatory Visit: Payer: BC Managed Care – PPO

## 2017-08-10 ENCOUNTER — Other Ambulatory Visit (INDEPENDENT_AMBULATORY_CARE_PROVIDER_SITE_OTHER): Payer: BC Managed Care – PPO

## 2017-08-10 ENCOUNTER — Encounter: Payer: Self-pay | Admitting: Obstetrics and Gynecology

## 2017-08-10 ENCOUNTER — Other Ambulatory Visit: Payer: Self-pay | Admitting: Obstetrics and Gynecology

## 2017-08-10 ENCOUNTER — Ambulatory Visit (INDEPENDENT_AMBULATORY_CARE_PROVIDER_SITE_OTHER): Payer: BC Managed Care – PPO | Admitting: Obstetrics and Gynecology

## 2017-08-10 VITALS — BP 134/80 | Wt 250.0 lb

## 2017-08-10 DIAGNOSIS — Z131 Encounter for screening for diabetes mellitus: Secondary | ICD-10-CM

## 2017-08-10 DIAGNOSIS — Z6841 Body Mass Index (BMI) 40.0 and over, adult: Secondary | ICD-10-CM

## 2017-08-10 DIAGNOSIS — Z3A27 27 weeks gestation of pregnancy: Secondary | ICD-10-CM

## 2017-08-10 DIAGNOSIS — O99280 Endocrine, nutritional and metabolic diseases complicating pregnancy, unspecified trimester: Secondary | ICD-10-CM

## 2017-08-10 DIAGNOSIS — O0990 Supervision of high risk pregnancy, unspecified, unspecified trimester: Secondary | ICD-10-CM

## 2017-08-10 DIAGNOSIS — Z0489 Encounter for examination and observation for other specified reasons: Secondary | ICD-10-CM | POA: Diagnosis not present

## 2017-08-10 DIAGNOSIS — IMO0002 Reserved for concepts with insufficient information to code with codable children: Secondary | ICD-10-CM

## 2017-08-10 DIAGNOSIS — O99210 Obesity complicating pregnancy, unspecified trimester: Secondary | ICD-10-CM

## 2017-08-10 DIAGNOSIS — Z113 Encounter for screening for infections with a predominantly sexual mode of transmission: Secondary | ICD-10-CM

## 2017-08-10 DIAGNOSIS — E039 Hypothyroidism, unspecified: Secondary | ICD-10-CM

## 2017-08-10 NOTE — Patient Instructions (Signed)

## 2017-08-10 NOTE — Progress Notes (Signed)
Routine Prenatal Care Visit  Subjective  NELLE MOUSTAFA is a 27 y.o. [name] at [redacted]w[redacted]d being seen today for ongoing prenatal care.  She is currently monitored for the following issues for this high-risk pregnancy and has Supervision of high risk pregnancy, antepartum; Obesity affecting pregnancy, antepartum; BMI 40.0-44.9, adult (HCC); and Hypothyroid in pregnancy, antepartum on their problem list.  ----------------------------------------------------------------------------------- Patient reports no complaints.   Contractions: Not present. Vag. Bleeding: None.  Movement: Present. Denies leaking of fluid.  Growth u/s shows 54th %ile.   ----------------------------------------------------------------------------------- The following portions of the patient's history were reviewed and updated as appropriate: allergies, current medications, past family history, past medical history, past social history, past surgical history and problem list. Problem list updated.   Objective  Blood pressure 134/80, weight 250 lb (113.4 kg), last menstrual period 11/12/2016. Pregravid weight 220 lb (99.8 kg) Total Weight Gain 30 lb (13.6 kg) Urinalysis: Urine Protein: Negative Urine Glucose: Negative  Fetal Status:     Movement: Present     General:  Alert, oriented and cooperative. Patient is in no acute distress.  Skin: Skin is warm and dry. No rash noted.   Cardiovascular: Normal heart rate noted  Respiratory: Normal respiratory effort, no problems with respiration noted  Abdomen: Soft, gravid, appropriate for gestational age. Pain/Pressure: Present     Pelvic:  Cervical exam deferred        Extremities: Normal range of motion.     Mental Status: Normal mood and affect. Normal behavior. Normal judgment and thought content.   Assessment   27 y.o. [name] at [redacted]w[redacted]d by  11/08/2017, by Ultrasound presenting for routine prenatal visit  Plan   Pregnancy#2 Problems (from 11/12/16 to present)    Problem  Noted Resolved   Hypothyroid in pregnancy, antepartum 04/13/2017 by Conard Novak, MD No   Overview Addendum 08/10/2017 10:00 AM by Conard Novak, MD    - initial TSH >3.0, started levothyroxine 50 mcg - recheck, TSH at 12/12 appt. - Normal [x]  recheck with 28 week labs = lab pending [ ]  follow up       Obesity affecting pregnancy, antepartum 03/16/2017 by Oswaldo Conroy, CNM No   BMI 40.0-44.9, adult (HCC) 03/16/2017 by Oswaldo Conroy, CNM No   Overview Addendum 08/10/2017  9:59 AM by Conard Novak, MD    BMI >=40 [x]  early 1h gtt -  [x]  u/s for dating [x]   [x]  nutritional goals [x]  folic acid 1mg  [x]  bASA (>12 weeks) [x]  consider nutrition consult [x]  consider maternal EKG 1st trimester [ ]  Growth u/s 28 [x] , 32 [ ] , 36 weeks [ ]  [ ]  NST/AFI weekly 36+ weeks (36[] , 37[] , 38[] , 39[] , 40[] ) [ ]  IOL by 41 weeks (scheduled, prn [] )       Supervision of high risk pregnancy, antepartum 03/09/2017 by Oswaldo Conroy, CNM No   Overview Addendum 08/10/2017  9:59 AM by Conard Novak, MD    [facility] Prenatal [facility]  Dating 6 week [place] Blood type: O Positive O/Positive/-- (10/11 1021)   Genetic Screen Declines all Antibody:Negative (10/11 1021)  Anatomic [place] complete Rubella: Immune 13.80 (10/11 1021) Varicella:  Non-immune  GTT Early: 129    Third trimester:  RPR: Non Reactive (10/11 1021) NR  Rhogam  HBsAg: Negative (10/11 1021) Neg  TDaP vaccine                       Flu Shot: HIV:   NR  Baby Food  GBS:   Contraception  Pap:  CBB     Support Person          Obesity (BMI 30-39.9) 03/09/2017 by Oswaldo Conroy, CNM 03/16/2017 by Oswaldo Conroy, CNM      Preterm labor symptoms and general obstetric precautions including but not limited to vaginal bleeding, contractions, leaking of fluid and fetal movement were reviewed in detail with the patient. Please refer to After Visit Summary for other counseling recommendations.    Return in about 2 weeks (around 08/24/2017) for Routine Prenatal Appointment.   [name], MD, [facility], [facility] 08/10/2017 10:00 AM

## 2017-08-11 ENCOUNTER — Encounter: Payer: Self-pay | Admitting: Obstetrics and Gynecology

## 2017-08-11 ENCOUNTER — Other Ambulatory Visit: Payer: Self-pay | Admitting: Obstetrics and Gynecology

## 2017-08-11 DIAGNOSIS — O99810 Abnormal glucose complicating pregnancy: Secondary | ICD-10-CM

## 2017-08-11 LAB — 28 WEEK RH+PANEL
BASOS: 0 %
Basophils Absolute: 0 10*3/uL (ref 0.0–0.2)
EOS (ABSOLUTE): 0.1 10*3/uL (ref 0.0–0.4)
Eos: 1 %
GESTATIONAL DIABETES SCREEN: 185 mg/dL — AB (ref 65–139)
HEMATOCRIT: 36.5 % (ref 34.0–46.6)
HEMOGLOBIN: 12.3 g/dL (ref 11.1–15.9)
HIV Screen 4th Generation wRfx: NONREACTIVE
Immature Grans (Abs): 0 10*3/uL (ref 0.0–0.1)
Immature Granulocytes: 0 %
LYMPHS ABS: 2 10*3/uL (ref 0.7–3.1)
Lymphs: 16 %
MCH: 28.5 pg (ref 26.6–33.0)
MCHC: 33.7 g/dL (ref 31.5–35.7)
MCV: 85 fL (ref 79–97)
MONOS ABS: 0.7 10*3/uL (ref 0.1–0.9)
Monocytes: 6 %
Neutrophils Absolute: 9.8 10*3/uL — ABNORMAL HIGH (ref 1.4–7.0)
Neutrophils: 77 %
Platelets: 269 10*3/uL (ref 150–379)
RBC: 4.31 x10E6/uL (ref 3.77–5.28)
RDW: 13.5 % (ref 12.3–15.4)
RPR: NONREACTIVE
WBC: 12.6 10*3/uL — ABNORMAL HIGH (ref 3.4–10.8)

## 2017-08-11 LAB — TSH+FREE T4
Free T4: 1.14 ng/dL (ref 0.82–1.77)
TSH: 1.57 u[IU]/mL (ref 0.450–4.500)

## 2017-08-16 ENCOUNTER — Other Ambulatory Visit: Payer: BC Managed Care – PPO

## 2017-08-16 DIAGNOSIS — O99810 Abnormal glucose complicating pregnancy: Secondary | ICD-10-CM

## 2017-08-17 LAB — GESTATIONAL GLUCOSE TOLERANCE
Glucose, Fasting: 101 mg/dL — ABNORMAL HIGH (ref 65–94)
Glucose, GTT - 1 Hour: 192 mg/dL — ABNORMAL HIGH (ref 65–179)
Glucose, GTT - 2 Hour: 166 mg/dL — ABNORMAL HIGH (ref 65–154)
Glucose, GTT - 3 Hour: 156 mg/dL — ABNORMAL HIGH (ref 65–139)

## 2017-08-24 ENCOUNTER — Telehealth: Payer: Self-pay

## 2017-08-24 ENCOUNTER — Other Ambulatory Visit: Payer: Self-pay | Admitting: Obstetrics and Gynecology

## 2017-08-24 DIAGNOSIS — O24410 Gestational diabetes mellitus in pregnancy, diet controlled: Secondary | ICD-10-CM

## 2017-08-24 DIAGNOSIS — O24419 Gestational diabetes mellitus in pregnancy, unspecified control: Secondary | ICD-10-CM | POA: Insufficient documentation

## 2017-08-24 NOTE — Telephone Encounter (Signed)
Pt aware.

## 2017-08-24 NOTE — Telephone Encounter (Signed)
-----   Message from Conard Novak, MD sent at 08/24/2017 12:37 PM EDT ----- Carla [name] did not pass her 3 hour glucose test.  Would you mind calling her and letting her know? I will be sending her to [facility] asap.  Thanks!

## 2017-08-25 ENCOUNTER — Ambulatory Visit (INDEPENDENT_AMBULATORY_CARE_PROVIDER_SITE_OTHER): Payer: BC Managed Care – PPO | Admitting: Obstetrics and Gynecology

## 2017-08-25 ENCOUNTER — Encounter: Payer: Self-pay | Admitting: Obstetrics and Gynecology

## 2017-08-25 VITALS — BP 130/84 | Wt 250.0 lb

## 2017-08-25 DIAGNOSIS — O0990 Supervision of high risk pregnancy, unspecified, unspecified trimester: Secondary | ICD-10-CM

## 2017-08-25 DIAGNOSIS — Z6841 Body Mass Index (BMI) 40.0 and over, adult: Secondary | ICD-10-CM

## 2017-08-25 DIAGNOSIS — O99280 Endocrine, nutritional and metabolic diseases complicating pregnancy, unspecified trimester: Secondary | ICD-10-CM

## 2017-08-25 DIAGNOSIS — E039 Hypothyroidism, unspecified: Secondary | ICD-10-CM

## 2017-08-25 DIAGNOSIS — Z3A29 29 weeks gestation of pregnancy: Secondary | ICD-10-CM

## 2017-08-25 DIAGNOSIS — O24410 Gestational diabetes mellitus in pregnancy, diet controlled: Secondary | ICD-10-CM

## 2017-08-25 DIAGNOSIS — O99210 Obesity complicating pregnancy, unspecified trimester: Secondary | ICD-10-CM

## 2017-08-25 NOTE — Progress Notes (Incomplete)
Routine Prenatal Care Visit  Subjective  ABI SHOULTS is a 27 y.o. [name] at [redacted]w[redacted]d being seen today for ongoing prenatal care.  She is currently monitored for the following issues for this {Blank single:19197::"high-risk","low-risk"} pregnancy and has Supervision of high risk pregnancy, antepartum; Obesity affecting pregnancy, antepartum; BMI 40.0-44.9, adult (HCC); Hypothyroid in pregnancy, antepartum; and Gestational diabetes on their problem list.  ----------------------------------------------------------------------------------- Patient reports {sx:[id number]}.   Contractions: Not present. Vag. Bleeding: None.  Movement: Present. Denies leaking of fluid.  ----------------------------------------------------------------------------------- The following portions of the patient's history were reviewed and updated as appropriate: allergies, current medications, past family history, past medical history, past social history, past surgical history and problem list. Problem list updated.   Objective  Blood pressure 130/84, weight 250 lb (113.4 kg), last menstrual period 11/12/2016. Pregravid weight 220 lb (99.8 kg) Total Weight Gain 30 lb (13.6 kg) Urinalysis: Urine Protein: Negative Urine Glucose: Negative  Fetal Status: Fetal Heart Rate (bpm): 135   Movement: Present     General:  Alert, oriented and cooperative. Patient is in no acute distress.  Skin: Skin is warm and dry. No rash noted.   Cardiovascular: Normal heart rate noted  Respiratory: Normal respiratory effort, no problems with respiration noted  Abdomen: Soft, gravid, appropriate for gestational age. Pain/Pressure: Absent     Pelvic:  {Blank single:19197::"Cervical exam performed","Cervical exam deferred"}        Extremities: Normal range of motion.  Edema: None  Mental Status: Normal mood and affect. Normal behavior. Normal judgment and thought content.   Assessment   27 y.o. [name] at [redacted]w[redacted]d by  11/08/2017, by Ultrasound  presenting for {Blank single:19197::"routine","work-in"} prenatal visit  Plan   Pregnancy#2 Problems (from 11/12/16 to present)    Problem Noted Resolved   Gestational diabetes 08/24/2017 by Conard Novak, MD No   Overview Addendum 08/25/2017 10:22 AM by Conard Novak, MD    - referral to [facility] sent (3/21) - appt 3/25      Hypothyroid in pregnancy, antepartum 04/13/2017 by Conard Novak, MD No   Overview Addendum 08/10/2017 10:00 AM by Conard Novak, MD    - initial TSH >3.0, started levothyroxine 50 mcg - recheck, TSH at 12/12 appt. - Normal [x]  recheck with 28 week labs = lab pending [ ]  follow up       Obesity affecting pregnancy, antepartum 03/16/2017 by Oswaldo Conroy, CNM No   BMI 40.0-44.9, adult (HCC) 03/16/2017 by Oswaldo Conroy, CNM No   Overview Addendum 08/10/2017  9:59 AM by Conard Novak, MD    BMI >=40 [x]  early 1h gtt -  [x]  u/s for dating [x]   [x]  nutritional goals [x]  folic acid 1mg  [x]  bASA (>12 weeks) [x]  consider nutrition consult [x]  consider maternal EKG 1st trimester [ ]  Growth u/s 28 [x] , 32 [ ] , 36 weeks [ ]  [ ]  NST/AFI weekly 36+ weeks (36[] , 37[] , 38[] , 39[] , 40[] ) [ ]  IOL by 41 weeks (scheduled, prn [] )       Supervision of high risk pregnancy, antepartum 03/09/2017 by Oswaldo Conroy, CNM No   Overview Addendum 08/11/2017 10:03 PM by Conard Novak, MD    [facility] Prenatal [facility]  Dating 6 week [place] Blood type: O Positive O/Positive/-- (10/11 1021)   Genetic Screen Declines all Antibody:Negative (10/11 1021)  Anatomic [place] complete Rubella: Immune 13.80 (10/11 1021) Varicella:  Non-immune  GTT Early: 129    Third trimester: 185, NEEDS 3 hour [ ]  RPR: Non Reactive (10/11  1021) NR  Rhogam  HBsAg: Negative (10/11 1021) Neg  TDaP vaccine                       Flu Shot: HIV:   NR  Baby Food                                GBS:   Contraception  Pap:  CBB     Support Person          Obesity (BMI 30-39.9)  03/09/2017 by Oswaldo Conroy, CNM 03/16/2017 by Oswaldo Conroy, CNM       {Blank single:19197::"Term","Preterm"} labor symptoms and general obstetric precautions including but not limited to vaginal bleeding, contractions, leaking of fluid and fetal movement were reviewed in detail with the patient. Please refer to After Visit Summary for other counseling recommendations.   Return in about 2 weeks (around 09/08/2017) for schedule u/s for growth/afi and Routine Prenatal Appointment.  [name], MD, [facility], [facility] 08/25/2017 10:29 AM  No vb lof.

## 2017-08-25 NOTE — Progress Notes (Signed)
Routine Prenatal Care Visit  Subjective  Carla [name] is a 27 y.o. [name] at [redacted]w[redacted]d being seen today for ongoing prenatal care.  She is currently monitored for the following issues for this high-risk pregnancy and has Supervision of high risk pregnancy, antepartum; Obesity affecting pregnancy, antepartum; BMI 40.0-44.9, adult (HCC); Hypothyroid in pregnancy, antepartum; and Gestational diabetes on their problem list.  ----------------------------------------------------------------------------------- Patient reports no complaints.   Contractions: Not present. Vag. Bleeding: None.  Movement: Present. Denies leaking of fluid.  ----------------------------------------------------------------------------------- The following portions of the patient's history were reviewed and updated as appropriate: allergies, current medications, past family history, past medical history, past social history, past surgical history and problem list. Problem list updated.   Objective  Blood pressure 130/84, weight 250 lb (113.4 kg), last menstrual period 11/12/2016. Pregravid weight 220 lb (99.8 kg) Total Weight Gain 30 lb (13.6 kg) Urinalysis: Urine Protein: Negative Urine Glucose: Negative  Fetal Status: Fetal Heart Rate (bpm): 135   Movement: Present     General:  Alert, oriented and cooperative. Patient is in no acute distress.  Skin: Skin is warm and dry. No rash noted.   Cardiovascular: Normal heart rate noted  Respiratory: Normal respiratory effort, no problems with respiration noted  Abdomen: Soft, gravid, appropriate for gestational age. Pain/Pressure: Absent     Pelvic:  Cervical exam deferred        Extremities: Normal range of motion.  Edema: None  Mental Status: Normal mood and affect. Normal behavior. Normal judgment and thought content.   Assessment   27 y.o. [name] at [redacted]w[redacted]d by  11/08/2017, by Ultrasound presenting for routine prenatal visit  Plan   Pregnancy#2 Problems (from 11/12/16  to present)    Problem Noted Resolved   Gestational diabetes 08/24/2017 by Conard Novak, MD No   Overview Addendum 08/25/2017 10:22 AM by Conard Novak, MD    - referral to [facility] sent (3/21) - appt 3/25      Hypothyroid in pregnancy, antepartum 04/13/2017 by Conard Novak, MD No   Overview Addendum 08/10/2017 10:00 AM by Conard Novak, MD    - initial TSH >3.0, started levothyroxine 50 mcg - recheck, TSH at 12/12 appt. - Normal [x]  recheck with 28 week labs = lab pending [ ]  follow up       Obesity affecting pregnancy, antepartum 03/16/2017 by Oswaldo Conroy, CNM No   BMI 40.0-44.9, adult (HCC) 03/16/2017 by Oswaldo Conroy, CNM No   Overview Addendum 08/10/2017  9:59 AM by Conard Novak, MD    BMI >=40 [x]  early 1h gtt -  [x]  u/s for dating [x]   [x]  nutritional goals [x]  folic acid 1mg  [x]  bASA (>12 weeks) [x]  consider nutrition consult [x]  consider maternal EKG 1st trimester [ ]  Growth u/s 28 [x] , 32 [ ] , 36 weeks [ ]  [ ]  NST/AFI weekly 36+ weeks (36[] , 37[] , 38[] , 39[] , 40[] ) [ ]  IOL by 41 weeks (scheduled, prn [] )       Supervision of high risk pregnancy, antepartum 03/09/2017 by Oswaldo Conroy, CNM No   Overview Addendum 08/11/2017 10:03 PM by Conard Novak, MD    [facility] P[facility]atal [facility]  Dating 6 week [place] Blood type: O Positive O/Positive/-- (10/11 1021)   Genetic Screen Declines all Antibody:Negative (10/11 1021)  Anatomic [place] complete Rubella: Immune 13.80 (10/11 1021) Varicella:  Non-immune  GTT Early: 129    Third trimester: 185, NEEDS 3 hour [ ]  RPR: Non Reactive (10/11 1021) NR  Rhogam  HBsAg: Negative (10/11 1021) Neg  TDaP vaccine                       Flu Shot: HIV:   NR  Baby Food                                GBS:   Contraception  Pap:  CBB     Support Person          Obesity (BMI 30-39.9) 03/09/2017 by Oswaldo Conroy, CNM 03/16/2017 by Oswaldo Conroy, CNM       Preterm labor symptoms and general  obstetric precautions including but not limited to vaginal bleeding, contractions, leaking of fluid and fetal movement were reviewed in detail with the patient. Please refer to After Visit Summary for other counseling recommendations.   - discussed new diagnosis of GDM. Has appt on 3/25 with [facility] for diabetes education.  - follow up 2 weeks to check BG log and obtain growth u/s - TDaP next appt  Return in about 2 weeks (around 09/08/2017) for schedule u/s for growth/afi and Routine Prenatal Appointment.  [name], MD, [facility], [facility] 08/25/2017 10:29 AM

## 2017-08-28 ENCOUNTER — Encounter: Payer: BC Managed Care – PPO | Attending: Obstetrics and Gynecology | Admitting: Dietician

## 2017-08-28 ENCOUNTER — Encounter: Payer: Self-pay | Admitting: Dietician

## 2017-08-28 ENCOUNTER — Encounter: Payer: Self-pay | Admitting: Obstetrics and Gynecology

## 2017-08-28 VITALS — BP 124/72 | Ht 62.0 in | Wt 245.6 lb

## 2017-08-28 DIAGNOSIS — Z713 Dietary counseling and surveillance: Secondary | ICD-10-CM | POA: Diagnosis present

## 2017-08-28 DIAGNOSIS — Z3A00 Weeks of gestation of pregnancy not specified: Secondary | ICD-10-CM | POA: Insufficient documentation

## 2017-08-28 DIAGNOSIS — O24410 Gestational diabetes mellitus in pregnancy, diet controlled: Secondary | ICD-10-CM | POA: Diagnosis not present

## 2017-08-28 NOTE — Progress Notes (Signed)
Appt. Start Time: *1330 Appt. End Time: 1500  GDM Class 1 Diabetes Overview - define DM; state own type of DM; identify functions of pancreas and insulin; define insulin deficiency vs insulin resistance  Psychosocial - identify DM as a source of stress; state the effects of stress on BG control; verbalize appropriate stress management techniques; identify personal stress issues   Nutritional Management - describe effects of food on blood glucose; identify sources of carbohydrate, protein and fat; verbalize the importance of balance meals in controlling blood glucose; identify meals as well balanced or not; estimate servings of carbohydrate from menus; use food labels to identify servings size, content of carbohydrate, fiber, protein, fat, saturated fat and sodium; recognize food sources of fat, saturated fat, trans fat, sodium and verbalize goals for intake; describe healthful appropriate food choices when dining out   Exercise - describe the effects of exercise on blood glucose and importance of regular exercise in controlling diabetes; state a plan for personal exercise; verbalize contraindications for exercise  Medications - state name, dose, timing of currently prescribed medications; describe types of medications available for diabetes  Insulin Training - prepare and administer insulin accurately; state correct rotation pattern; verbalize safe and lawful needle disposal  Self-Monitoring - state importance of HBGM and demo procedure accurately; use HBGM results to effectively manage diabetes; identify importance of regular HbA1C testing and goals for results  Acute Complications/Sick Day Guidelines - recognize hyperglycemia and hypoglycemia with causes and effects; identify blood glucose results as high, low or in control; list steps in treating and preventing high and low blood glucose; state appropriate measure to manage blood glucose when ill (need for meds, HBGM plan, when to call physician,  need for fluids)  Chronic Complications/Foot, Skin, Eye Dental Care - identify possible long-term complications of diabetes (retinopathy, neuropathy, nephropathy, cardiovascular disease, infections); explain steps in prevention and treatment of chronic complications; state importance of daily self-foot exams; describe how to examine feet and what to look for; explain appropriate eye and dental care  Lifestyle Changes/Goals & Health/Community Resources - state benefits of making appropriate lifestyle changes; identify habits that need to change (meals, tobacco, alcohol); identify strategies to reduce risk factors for personal health; set goals for proper diabetes care; state need for and frequency of healthcare follow-up; describe appropriate community resources for good health (ADA, web sites, apps)   Pregnancy/Sexual Health - define gestational diabetes; state importance of good blood glucose control and birth control prior to pregnancy; state importance of good blood glucose control in preventing sexual problems (impotence, vaginal dryness, infections, loss of desire); state relationship of blood glucose control and pregnancy outcome; describe risk of maternal and fetal complications  Teaching Materials Used: Meter-Accuchek Guide General Meal Planning Guidelines Daily Food Record Gestational Diabetes Booklet Gestational Video Goals for Healthy Pregnancy

## 2017-09-04 ENCOUNTER — Encounter: Payer: BC Managed Care – PPO | Attending: Obstetrics and Gynecology | Admitting: Dietician

## 2017-09-04 ENCOUNTER — Encounter: Payer: Self-pay | Admitting: Dietician

## 2017-09-04 VITALS — BP 114/64 | Ht 62.0 in | Wt 246.1 lb

## 2017-09-04 DIAGNOSIS — O24410 Gestational diabetes mellitus in pregnancy, diet controlled: Secondary | ICD-10-CM | POA: Diagnosis not present

## 2017-09-04 DIAGNOSIS — Z3A00 Weeks of gestation of pregnancy not specified: Secondary | ICD-10-CM | POA: Diagnosis not present

## 2017-09-04 DIAGNOSIS — Z713 Dietary counseling and surveillance: Secondary | ICD-10-CM | POA: Diagnosis not present

## 2017-09-04 NOTE — Progress Notes (Signed)
   Patient's BG record indicates BGs are at times above goal, with fasting BGs ranging 96-115 + one reading of 133; and post-meal BGs ranging 83-140.   Patient's food diary indicates healthy food choices, with most meals below goal for carbohydrate intake; some inconsistent carbohydrate amounts. She is choosing whole grain starches.   Provided 1700kcal meal plan, and wrote individualized menus based on patient's food preferences.  Instructed patient on food safety, including avoidance of Listeriosis, and limiting mercury from fish.  Discussed importance of maintaining healthy lifestyle habits to reduce risk of Type 2 DM as well as Gestational DM with any future pregnancies.  Advised patient to use any remaining testing supplies to test some BGs after delivery, and to have BG tested ideally annually, as well as prior to attempting future pregnancies.

## 2017-09-04 NOTE — Patient Instructions (Signed)
   Continue to make healthy food choices, great job!  Have either a protein drink or a food snack, no need to have both at one time.

## 2017-09-11 ENCOUNTER — Ambulatory Visit (INDEPENDENT_AMBULATORY_CARE_PROVIDER_SITE_OTHER): Payer: BC Managed Care – PPO | Admitting: Obstetrics and Gynecology

## 2017-09-11 ENCOUNTER — Encounter: Payer: BC Managed Care – PPO | Admitting: Obstetrics and Gynecology

## 2017-09-11 ENCOUNTER — Ambulatory Visit (INDEPENDENT_AMBULATORY_CARE_PROVIDER_SITE_OTHER): Payer: BC Managed Care – PPO

## 2017-09-11 VITALS — BP 122/76 | Wt 246.0 lb

## 2017-09-11 DIAGNOSIS — Z6841 Body Mass Index (BMI) 40.0 and over, adult: Secondary | ICD-10-CM

## 2017-09-11 DIAGNOSIS — O0990 Supervision of high risk pregnancy, unspecified, unspecified trimester: Secondary | ICD-10-CM

## 2017-09-11 DIAGNOSIS — O99210 Obesity complicating pregnancy, unspecified trimester: Secondary | ICD-10-CM | POA: Diagnosis not present

## 2017-09-11 DIAGNOSIS — E039 Hypothyroidism, unspecified: Secondary | ICD-10-CM

## 2017-09-11 DIAGNOSIS — O99280 Endocrine, nutritional and metabolic diseases complicating pregnancy, unspecified trimester: Secondary | ICD-10-CM

## 2017-09-11 DIAGNOSIS — Z23 Encounter for immunization: Secondary | ICD-10-CM | POA: Diagnosis not present

## 2017-09-11 DIAGNOSIS — O24410 Gestational diabetes mellitus in pregnancy, diet controlled: Secondary | ICD-10-CM | POA: Diagnosis not present

## 2017-09-11 DIAGNOSIS — Z3A31 31 weeks gestation of pregnancy: Secondary | ICD-10-CM | POA: Diagnosis not present

## 2017-09-11 MED ORDER — GLYBURIDE 2.5 MG PO TABS: 2.5000 mg | ORAL_TABLET | Freq: Every day | ORAL | 2 refills | Status: DC

## 2017-09-11 NOTE — Progress Notes (Signed)
Routine Prenatal Care Visit  Subjective  Carla [name] is a 27 y.o. [name] at [redacted]w[redacted]d being seen today for ongoing prenatal care.  She is currently monitored for the following issues for this high-risk pregnancy and has Supervision of high risk pregnancy, antepartum; Obesity affecting pregnancy, antepartum; BMI 40.0-44.9, adult (HCC); Hypothyroid in pregnancy, antepartum; and Gestational diabetes on their problem list.  ----------------------------------------------------------------------------------- Patient reports no complaints.   Contractions: Not present. Vag. Bleeding: None.  Movement: Present. Denies leaking of fluid.  ----------------------------------------------------------------------------------- The following portions of the patient's history were reviewed and updated as appropriate: allergies, current medications, past family history, past medical history, past social history, past surgical history and problem list. Problem list updated.   Objective  Blood pressure 122/76, weight 246 lb (111.6 kg), last menstrual period 11/12/2016. Pregravid weight 220 lb (99.8 kg) Total Weight Gain 26 lb (11.8 kg) Urinalysis: Urine Protein: Negative Urine Glucose: Negative  Fetal Status: Fetal Heart Rate (bpm): 135 Fundal Height: 32 cm Movement: Present     General:  Alert, oriented and cooperative. Patient is in no acute distress.  Skin: Skin is warm and dry. No rash noted.   Cardiovascular: Normal heart rate noted  Respiratory: Normal respiratory effort, no problems with respiration noted  Abdomen: Soft, gravid, appropriate for gestational age. Pain/Pressure: Absent     Pelvic:  Cervical exam deferred        Extremities: Normal range of motion.     ental Status: Normal mood and affect. Normal behavior. Normal judgment and thought content.     Assessment   27 y.o. [name] at [redacted]w[redacted]d by  11/08/2017, by Ultrasound presenting for routine prenatal visit  Plan   Pregnancy#2 Problems  (from 11/12/16 to present)    Problem Noted Resolved   Gestational diabetes 08/24/2017 by Conard Novak, MD No   Overview Addendum 09/11/2017 12:16 PM by Vena Austria, MD    Current Diabetic Medications:  Glyburide  - started on glyburide 2.5mg  qHS on 09/11/2016   [X]  Diabetes Education and Testing Supplies [X]  Nutrition Counsult [ ]  Fetal ECHO after 22-24 weeks  [ ]  US fetal growth every 4 weeks starting at 28 weeks [X]  Twice weekly NST starting at [redacted] weeks gestation [ ]  Delivery planning contingent on fetal growth, AFI, glycemic control, and other co-morbidities but at least by 39 weeks  Baseline and surveillance labs (pulled in from [facility], refresh links as needed)  Lab Results  Component Value Date   TSH 1.570 08/10/2017   No results found for: HGBA1C  Antenatal Testing Class of DM U/S NST/AFI DELIVERY  Diabetes   A1 - good control - O24.410    A2 - good control - O24.419      A2  - poor control or poor compliance - O24.419, E11.65   (Macrosomia or polyhydramnios) **E11.65 is extra code for poor control**    A2/B - O24.919  and B-C O24.319  Poor control B-C or D-R-F-T - O24.319  or  Type I DM - O24.019  20-38  20-38  20-24-28-32-36   20-24-28-32-35-38//fetal echo  20-24-27-30-33-36-38//fetal echo  40  32//2 x wk  32//2 x wk   32//2 x wk  28//BPP wkly then 32//2 x wk  40  39  PRN   39  PRN          Hypothyroid in pregnancy, antepartum 04/13/2017 by Conard Novak, MD No   Overview Addendum 09/11/2017 12:13 PM by Vena Austria, MD    - initial TSH >3.0,  started levothyroxine 50 mcg - recheck, TSH at 12/12 appt. - Normal [x]  recheck with 28 week labs normal       Obesity affecting pregnancy, antepartum 03/16/2017 by Oswaldo Conroy, CNM No   BMI 40.0-44.9, adult (HCC) 03/16/2017 by Oswaldo Conroy, CNM No   Overview Addendum 08/10/2017  9:59 AM by Conard Novak, MD    BMI >=40 [x]  early 1h gtt -  [x]  u/s for dating [x]     [x]  nutritional goals [x]  folic acid 1mg  [x]  bASA (>12 weeks) [x]  consider nutrition consult [x]  consider maternal EKG 1st trimester [ ]  Growth u/s 28 [x] , 32 [ ] , 36 weeks [ ]  [ ]  NST/AFI weekly 36+ weeks (36[] , 37[] , 38[] , 39[] , 40[] ) [ ]  IOL by 41 weeks (scheduled, prn [] )       Supervision of high risk pregnancy, antepartum 03/09/2017 by Oswaldo Conroy, CNM No   Overview Addendum 09/11/2017 12:13 PM by Vena Austria, MD    [facility] Prenatal Labs  Dating 6 week [place] Blood type: O Positive O/Positive/-- (10/11 1021)   Genetic Screen Declines all Antibody:Negative (10/11 1021)  Anatomic [place] Normal female  Rubella: Immune 13.80 (10/11 1021) Varicella:  Non-immune  GTT Early: 129    Third trimester: 185  3 hour 101, 192, 166, 156 RPR: Non Reactive (10/11 1021) NR  Rhogam N/A HBsAg: Negative (10/11 1021) Neg  TDaP vaccine 09/11/2017    HIV:   NR  Baby Food                                GBS:   Contraception  Pap: 06/23/2014 NILM [ ]  repeat due postpartum  CBB     Support Person          Obesity (BMI 30-39.9) 03/09/2017 by Oswaldo Conroy, CNM 03/16/2017 by Oswaldo Conroy, CNM      Gestational age appropriate obstetric precautions including but not limited to vaginal bleeding, contractions, leaking of fluid and fetal movement were reviewed in detail with the patient.   - fasting values consistently elevated, discussed insulin as first line recommended therapy vs oral such as metformin and glyburide.  After discussion will start glyburide 2.5mg  qhs - Follow up 1 week to assess BG response - Start NST/AFI - next growth scan in 2 weeks  Return in about 1 week (around 09/18/2017) for ROB.  Vena Austria, MD, [facility], [facility] 09/11/2017, 12:17 PM

## 2017-09-11 NOTE — Progress Notes (Signed)
ROB TDAP given 

## 2017-09-18 ENCOUNTER — Encounter: Payer: Self-pay | Admitting: Obstetrics and Gynecology

## 2017-09-18 ENCOUNTER — Ambulatory Visit: Payer: BC Managed Care – PPO | Admitting: Obstetrics and Gynecology

## 2017-09-18 VITALS — BP 126/80 | Wt 244.0 lb

## 2017-09-18 DIAGNOSIS — E039 Hypothyroidism, unspecified: Secondary | ICD-10-CM

## 2017-09-18 DIAGNOSIS — O99280 Endocrine, nutritional and metabolic diseases complicating pregnancy, unspecified trimester: Secondary | ICD-10-CM

## 2017-09-18 DIAGNOSIS — O0990 Supervision of high risk pregnancy, unspecified, unspecified trimester: Secondary | ICD-10-CM

## 2017-09-18 DIAGNOSIS — O24415 Gestational diabetes mellitus in pregnancy, controlled by oral hypoglycemic drugs: Secondary | ICD-10-CM

## 2017-09-18 DIAGNOSIS — O99210 Obesity complicating pregnancy, unspecified trimester: Secondary | ICD-10-CM

## 2017-09-18 DIAGNOSIS — Z6841 Body Mass Index (BMI) 40.0 and over, adult: Secondary | ICD-10-CM

## 2017-09-18 NOTE — Progress Notes (Incomplete)
Routine Prenatal Care Visit  Subjective  Carla [name] is a 27 y.o. [name] at [redacted]w[redacted]d being seen today for ongoing prenatal care.  She is currently monitored for the following issues for this {Blank single:19197::"high-risk","low-risk"} pregnancy and has Supervision of high risk pregnancy, antepartum; Obesity affecting pregnancy, antepartum; BMI 40.0-44.9, adult (HCC); Hypothyroid in pregnancy, antepartum; and Gestational diabetes on their problem list.  ----------------------------------------------------------------------------------- Patient reports {sx:14538}.   Contractions: Not present. Vag. Bleeding: None.  Movement: Present. Denies leaking of fluid.  ----------------------------------------------------------------------------------- The following portions of the patient's history were reviewed and updated as appropriate: allergies, current medications, past family history, past medical history, past social history, past surgical history and problem list. Problem list updated.   Objective  Blood pressure 126/80, weight 244 lb (110.7 kg), last menstrual period 11/12/2016. Pregravid weight 220 lb (99.8 kg) Total Weight Gain 24 lb (10.9 kg) Urinalysis:      Fetal Status: Fetal Heart Rate (bpm): 155   Movement: Present     General:  Alert, oriented and cooperative. Patient is in no acute distress.  Skin: Skin is warm and dry. No rash noted.   Cardiovascular: Normal heart rate noted  Respiratory: Normal respiratory effort, no problems with respiration noted  Abdomen: Soft, gravid, appropriate for gestational age. Pain/Pressure: Absent     Pelvic:  {Blank single:19197::"Cervical exam performed","Cervical exam deferred"}        Extremities: Normal range of motion.     Mental Status: Normal mood and affect. Normal behavior. Normal judgment and thought content.   Assessment   27 y.o. [name] at [redacted]w[redacted]d by  11/08/2017, by Ultrasound presenting for {Blank single:19197::"routine","work-in"}  prenatal visit  Plan   Pregnancy#2 Problems (from 11/12/16 to present)    Problem Noted Resolved   Gestational diabetes 08/24/2017 by Conard Novak, MD No   Overview Addendum 09/11/2017 12:16 PM by Vena Austria, MD    Current Diabetic Medications:  Glyburide  - started on glyburide 2.5mg  qHS on 09/11/2016   [X]  Diabetes Education and Testing Supplies [X]  Nutrition Counsult [ ]  Fetal ECHO after 22-24 weeks  [ ]  US fetal growth every 4 weeks starting at 28 weeks [X]  Twice weekly NST starting at [redacted] weeks gestation [ ]  Delivery planning contingent on fetal growth, AFI, glycemic control, and other co-morbidities but at least by 39 weeks  Baseline and surveillance labs (pulled in from [facility], refresh links as needed)  Lab Results  Component Value Date   TSH 1.570 08/10/2017   No results found for: HGBA1C  Antenatal Testing Class of DM U/S NST/AFI DELIVERY  Diabetes   A1 - good control - O24.410    A2 - good control - O24.419      A2  - poor control or poor compliance - O24.419, E11.65   (Macrosomia or polyhydramnios) **E11.65 is extra code for poor control**    A2/B - O24.919  and B-C O24.319  Poor control B-C or D-R-F-T - O24.319  or  Type I DM - O24.019  20-38  20-38  20-24-28-32-36   20-24-28-32-35-38//fetal echo  20-24-27-30-33-36-38//fetal echo  40  32//2 x wk  32//2 x wk   32//2 x wk  28//BPP wkly then 32//2 x wk  40  39  PRN   39  PRN          Hypothyroid in pregnancy, antepartum 04/13/2017 by Conard Novak, MD No   Overview Addendum 09/11/2017 12:13 PM by Vena Austria, MD    - initial TSH >3.0, started levothyroxine 50 mcg -  recheck, TSH at 12/12 appt. - Normal [x]  recheck with 28 week labs normal       Obesity affecting pregnancy, antepartum 03/16/2017 by Oswaldo Conroy, CNM No   BMI 40.0-44.9, adult (HCC) 03/16/2017 by Oswaldo Conroy, CNM No   Overview Addendum 08/10/2017  9:59 AM by Conard Novak, MD    BMI  >=40 [x]  early 1h gtt -  [x]  u/s for dating [x]   [x]  nutritional goals [x]  folic acid 1mg  [x]  bASA (>12 weeks) [x]  consider nutrition consult [x]  consider maternal EKG 1st trimester [ ]  Growth u/s 28 [x] , 32 [ ] , 36 weeks [ ]  [ ]  NST/AFI weekly 36+ weeks (36[] , 37[] , 38[] , 39[] , 40[] ) [ ]  IOL by 41 weeks (scheduled, prn [] )       Supervision of high risk pregnancy, antepartum 03/09/2017 by Oswaldo Conroy, CNM No   Overview Addendum 09/11/2017 12:13 PM by Vena Austria, MD    [facility] Prenatal Labs  Dating 6 week [place] Blood type: O Positive O/Positive/-- (10/11 1021)   Genetic Screen Declines all Antibody:Negative (10/11 1021)  Anatomic [place] Normal female  Rubella: Immune 13.80 (10/11 1021) Varicella:  Non-immune  GTT Early: 129    Third trimester: 185  3 hour 101, 192, 166, 156 RPR: Non Reactive (10/11 1021) NR  Rhogam N/A HBsAg: Negative (10/11 1021) Neg  TDaP vaccine 09/11/2017    HIV:   NR  Baby Food                                GBS:   Contraception  Pap: 06/23/2014 NILM [ ]  repeat due postpartum  CBB     Support Person          Obesity (BMI 30-39.9) 03/09/2017 by Oswaldo Conroy, CNM 03/16/2017 by Oswaldo Conroy, CNM       {Blank single:19197::"Term","Preterm"} labor symptoms and general obstetric precautions including but not limited to vaginal bleeding, contractions, leaking of fluid and fetal movement were reviewed in detail with the patient. Please refer to After Visit Summary for other counseling recommendations.   Return in about 1 week (around 09/25/2017) for u/s for AFI and Routine Prenatal Appointment with NST.  [name], MD, [facility], [facility] 09/18/2017 8:50 AM

## 2017-09-18 NOTE — Progress Notes (Signed)
Routine Prenatal Care Visit  Subjective  Carla [name] is a 27 y.o. [name] at [redacted]w[redacted]d being seen today for ongoing prenatal care.  She is currently monitored for the following issues for this high-risk pregnancy and has Supervision of high risk pregnancy, antepartum; Obesity affecting pregnancy, antepartum; BMI 40.0-44.9, adult (HCC); Hypothyroid in pregnancy, antepartum; and Gestational diabetes on their problem list.  ----------------------------------------------------------------------------------- Patient reports no complaints.   Contractions: Not present. Vag. Bleeding: None.  Movement: Present. Denies leaking of fluid.  GDM on glyburide 2.5 mg  Fasting 4/7 above 95  2h pp: < 50% above range ----------------------------------------------------------------------------------- The following portions of the patient's history were reviewed and updated as appropriate: allergies, current medications, past family history, past medical history, past social history, past surgical history and problem list. Problem list updated.   Objective  Blood pressure 126/80, weight 244 lb (110.7 kg), last menstrual period 11/12/2016. Pregravid weight 220 lb (99.8 kg) Total Weight Gain 24 lb (10.9 kg) Urinalysis:      Fetal Status: Fetal Heart Rate (bpm): 155   Movement: Present     General:  Alert, oriented and cooperative. Patient is in no acute distress.  Skin: Skin is warm and dry. No rash noted.   Cardiovascular: Normal heart rate noted  Respiratory: Normal respiratory effort, no problems with respiration noted  Abdomen: Soft, gravid, appropriate for gestational age. Pain/Pressure: Absent     Pelvic:  Cervical exam deferred        Extremities: Normal range of motion.     Mental Status: Normal mood and affect. Normal behavior. Normal judgment and thought content.   Assessment   27 y.o. [name] at [redacted]w[redacted]d by  11/08/2017, by Ultrasound presenting for routine prenatal visit  Plan   Pregnancy#2  Problems (from 11/12/16 to present)    Problem Noted Resolved   Gestational diabetes 08/24/2017 by Conard Novak, MD No   Overview Addendum 09/11/2017 12:16 PM by Vena Austria, MD    Current Diabetic Medications:  Glyburide  - started on glyburide 2.5mg  qHS on 09/11/2016  - increased glyburide to 5 mg qHS on 4/15  [X]  Diabetes Education and Testing Supplies [X]  Nutrition Counsult [ ]  Fetal ECHO after 22-24 weeks  [ ]  US fetal growth every 4 weeks starting at 28 weeks [X]  Twice weekly NST starting at [redacted] weeks gestation [ ]  Delivery planning contingent on fetal growth, AFI, glycemic control, and other co-morbidities but at least by 39 weeks  Baseline and surveillance labs (pulled in from [facility], refresh links as needed)  Lab Results  Component Value Date   TSH 1.570 08/10/2017   No results found for: HGBA1C  Antenatal Testing Class of DM U/S NST/AFI DELIVERY  Diabetes   A1 - good control - O24.410    A2 - good control - O24.419      A2  - poor control or poor compliance - O24.419, E11.65   (Macrosomia or polyhydramnios) **E11.65 is extra code for poor control**    A2/B - O24.919  and B-C O24.319  Poor control B-C or D-R-F-T - O24.319  or  Type I DM - O24.019  20-38  20-38  20-24-28-32-36   20-24-28-32-35-38//fetal echo  20-24-27-30-33-36-38//fetal echo  40  32//2 x wk  32//2 x wk   32//2 x wk  28//BPP wkly then 32//2 x wk  40  39  PRN   39  PRN          Hypothyroid in pregnancy, antepartum 04/13/2017 by Conard Novak, MD No  Overview Addendum 09/11/2017 12:13 PM by Vena Austria, MD    - initial TSH >3.0, started levothyroxine 50 mcg - recheck, TSH at 12/12 appt. - Normal [x]  recheck with 28 week labs normal       Obesity affecting pregnancy, antepartum 03/16/2017 by Oswaldo Conroy, CNM No   BMI 40.0-44.9, adult (HCC) 03/16/2017 by Oswaldo Conroy, CNM No   Overview Addendum 08/10/2017  9:59 AM by Conard Novak, MD     BMI >=40 [x]  early 1h gtt -  [x]  u/s for dating [x]   [x]  nutritional goals [x]  folic acid 1mg  [x]  bASA (>12 weeks) [x]  consider nutrition consult [x]  consider maternal EKG 1st trimester [ ]  Growth u/s 28 [x] , 32 [ ] , 36 weeks [ ]  [ ]  NST/AFI weekly 36+ weeks (36[] , 37[] , 38[] , 39[] , 40[] ) [ ]  IOL by 41 weeks (scheduled, prn [] )       Supervision of high risk pregnancy, antepartum 03/09/2017 by Oswaldo Conroy, CNM No   Overview Addendum 09/11/2017 12:13 PM by Vena Austria, MD    [facility] Prenatal Labs  Dating 6 week [place] Blood type: O Positive O/Positive/-- (10/11 1021)   Genetic Screen Declines all Antibody:Negative (10/11 1021)  Anatomic [place] Normal female  Rubella: Immune 13.80 (10/11 1021) Varicella:  Non-immune  GTT Early: 129    Third trimester: 185  3 hour 101, 192, 166, 156 RPR: Non Reactive (10/11 1021) NR  Rhogam N/A HBsAg: Negative (10/11 1021) Neg  TDaP vaccine 09/11/2017    HIV:   NR  Baby Food                                GBS:   Contraception  Pap: 06/23/2014 NILM [ ]  repeat due postpartum  CBB     Support Person          Obesity (BMI 30-39.9) 03/09/2017 by Oswaldo Conroy, CNM 03/16/2017 by Oswaldo Conroy, CNM      Preterm labor symptoms and general obstetric precautions including but not limited to vaginal bleeding, contractions, leaking of fluid and fetal movement were reviewed in detail with the patient. Please refer to After Visit Summary for other counseling recommendations.   Return in about 1 week (around 09/25/2017) for u/s for AFI and Routine Prenatal Appointment with NST.  [name], MD, [facility], [facility] 09/18/2017 8:50 AM

## 2017-09-25 ENCOUNTER — Other Ambulatory Visit: Payer: BC Managed Care – PPO

## 2017-09-25 ENCOUNTER — Encounter: Payer: BC Managed Care – PPO | Admitting: Obstetrics and Gynecology

## 2017-09-27 ENCOUNTER — Encounter: Payer: Self-pay | Admitting: Obstetrics and Gynecology

## 2017-09-27 ENCOUNTER — Ambulatory Visit (INDEPENDENT_AMBULATORY_CARE_PROVIDER_SITE_OTHER): Payer: BC Managed Care – PPO | Admitting: Obstetrics and Gynecology

## 2017-09-27 ENCOUNTER — Ambulatory Visit (INDEPENDENT_AMBULATORY_CARE_PROVIDER_SITE_OTHER): Payer: BC Managed Care – PPO

## 2017-09-27 VITALS — BP 128/84 | Wt 246.0 lb

## 2017-09-27 DIAGNOSIS — Z6841 Body Mass Index (BMI) 40.0 and over, adult: Secondary | ICD-10-CM

## 2017-09-27 DIAGNOSIS — Z3A34 34 weeks gestation of pregnancy: Secondary | ICD-10-CM

## 2017-09-27 DIAGNOSIS — O24415 Gestational diabetes mellitus in pregnancy, controlled by oral hypoglycemic drugs: Secondary | ICD-10-CM

## 2017-09-27 DIAGNOSIS — O0990 Supervision of high risk pregnancy, unspecified, unspecified trimester: Secondary | ICD-10-CM | POA: Diagnosis not present

## 2017-09-27 DIAGNOSIS — E039 Hypothyroidism, unspecified: Secondary | ICD-10-CM

## 2017-09-27 DIAGNOSIS — O99280 Endocrine, nutritional and metabolic diseases complicating pregnancy, unspecified trimester: Secondary | ICD-10-CM

## 2017-09-27 DIAGNOSIS — O99210 Obesity complicating pregnancy, unspecified trimester: Secondary | ICD-10-CM | POA: Diagnosis not present

## 2017-09-27 NOTE — Progress Notes (Signed)
Routine Prenatal Care Visit  Subjective  Carla [name] is a 27 y.o. [name] at [redacted]w[redacted]d being seen today for ongoing prenatal care.  She is currently monitored for the following issues for this high-risk pregnancy and has Supervision of high risk pregnancy, antepartum; Obesity affecting pregnancy, antepartum; BMI 40.0-44.9, adult (HCC); Hypothyroid in pregnancy, antepartum; and Gestational diabetes on their problem list.  ----------------------------------------------------------------------------------- Patient reports no complaints.   Contractions: Not present. Vag. Bleeding: None.  Movement: Present. Denies leaking of fluid.  GDM: not controlled at all checks throughout the day.  AFI today 16.6 cm ----------------------------------------------------------------------------------- The following portions of the patient's history were reviewed and updated as appropriate: allergies, current medications, past family history, past medical history, past social history, past surgical history and problem list. Problem list updated.   Objective  Blood pressure 128/84, weight 246 lb (111.6 kg), last menstrual period 11/12/2016. Pregravid weight 220 lb (99.8 kg) Total Weight Gain 26 lb (11.8 kg) Urinalysis:      Fetal Status: Fetal Heart Rate (bpm): 150   Movement: Present     General:  Alert, oriented and cooperative. Patient is in no acute distress.  Skin: Skin is warm and dry. No rash noted.   Cardiovascular: Normal heart rate noted  Respiratory: Normal respiratory effort, no problems with respiration noted  Abdomen: Soft, gravid, appropriate for gestational age. Pain/Pressure: Absent     Pelvic:  Cervical exam deferred        Extremities: Normal range of motion.  Edema: None  Mental Status: Normal mood and affect. Normal behavior. Normal judgment and thought content.   NST Baseline FHR: 145 beats/min Variability: moderate Accelerations: present Decelerations: absent Tocometry: not  done  Interpretation:  INDICATIONS: gestational diabetes mellitus RESULTS:  A NST procedure was performed with FHR monitoring and a normal baseline established, appropriate time of 20-40 minutes of evaluation, and accels >2 seen w 15x15 characteristics.  Results show a REACTIVE NST.    US Ob Limited  Result Date: 09/29/2017 ULTRASOUND REPORT Patient Name: Carla [name] DOB: [birth year] MRN: [id number] Location: [facility] Date of Service: 09/27/2017 Indications: Amniotic Fluid Index Findings: Mason Jim intrauterine pregnancy is visualized with FHR at 153 BPM. Fetal presentation is Cephalic. Placenta: Anterior, grade 2. AFI: 16.64 cm Impression: 1. [redacted]w[redacted]d Viable Singleton Intrauterine pregnancy dated by previously established criteria. 2. AFI is 16.64 cm. Recommendations: 1.Clinical correlation with the patient's History and Physical Exam. [name], RDMS, RVT The ultrasound images and findings were reviewed by me and I agree with the above report. [name], MD, [facility], [facility] 09/29/2017 1:46 PM     Assessment   26 y.o. [name] at [redacted]w[redacted]d by  11/08/2017, by Ultrasound presenting for routine prenatal visit  Plan   Pregnancy#2 Problems (from 11/12/16 to present)    Problem Noted Resolved   Gestational diabetes 08/24/2017 by Conard Novak, MD No   Overview Addendum 09/18/2017  8:51 AM by Conard Novak, MD    Current Diabetic Medications:  Glyburide  - started on glyburide 2.5mg  qHS on 09/11/2016  - increase glyburide 5 mg qHS on 09/18/17  [X]  Diabetes Education and Testing Supplies [X]  Nutrition Counsult [ ]  Fetal ECHO after 22-24 weeks  [ ]  US fetal growth every 4 weeks starting at 28 weeks (30 weeks, 37 %ile) [X]  Twice weekly NST starting at [redacted] weeks gestation [ ]  Delivery planning contingent on fetal growth, AFI, glycemic control, and other co-morbidities but at least by 39 weeks  Baseline and surveillance  labs (pulled in from [facility],  refresh links as needed)  Lab Results  Component Value Date   TSH 1.570 08/10/2017   No results found for: HGBA1C  Antenatal Testing Class of DM U/S NST/AFI DELIVERY  Diabetes   A1 - good control - O24.410    A2 - good control - O24.419      A2  - poor control or poor compliance - O24.419, E11.65   (Macrosomia or polyhydramnios) **E11.65 is extra code for poor control**    A2/B - O24.919  and B-C O24.319  Poor control B-C or D-R-F-T - O24.319  or  Type I DM - O24.019  20-38  20-38  20-24-28-32-36   20-24-28-32-35-38//fetal echo  20-24-27-30-33-36-38//fetal echo  40  32//2 x wk  32//2 x wk   32//2 x wk  28//BPP wkly then 32//2 x wk  40  39  PRN   39  PRN          Hypothyroid in pregnancy, antepartum 04/13/2017 by Conard Novak, MD No   Overview Addendum 09/11/2017 12:13 PM by Vena Austria, MD    - initial TSH >3.0, started levothyroxine 50 mcg - recheck, TSH at 12/12 appt. - Normal [x]  recheck with 28 week labs normal       Obesity affecting pregnancy, antepartum 03/16/2017 by Oswaldo Conroy, CNM No   BMI 40.0-44.9, adult (HCC) 03/16/2017 by Oswaldo Conroy, CNM No   Overview Addendum 08/10/2017  9:59 AM by Conard Novak, MD    BMI >=40 [x]  early 1h gtt -  [x]  u/s for dating [x]   [x]  nutritional goals [x]  folic acid 1mg  [x]  bASA (>12 weeks) [x]  consider nutrition consult [x]  consider maternal EKG 1st trimester [ ]  Growth u/s 28 [x] , 32 [ ] , 36 weeks [ ]  [ ]  NST/AFI weekly 36+ weeks (36[] , 37[] , 38[] , 39[] , 40[] ) [ ]  IOL by 41 weeks (scheduled, prn [] )       Supervision of high risk pregnancy, antepartum 03/09/2017 by Oswaldo Conroy, CNM No   Overview Addendum 09/11/2017 12:13 PM by Vena Austria, MD    Clinic [facility] Prenatal Labs  Dating 6 week [place] Blood type: O Positive O/Positive/-- (10/11 1021)   Genetic Screen Declines all Antibody:Negative (10/11 1021)  Anatomic [place] Normal female  Rubella: Immune 13.80 (10/11  1021) Varicella:  Non-immune  GTT Early: 129    Third trimester: 185  3 hour 101, 192, 166, 156 RPR: Non Reactive (10/11 1021) NR  Rhogam N/A HBsAg: Negative (10/11 1021) Neg  TDaP vaccine 09/11/2017    HIV:   NR  Baby Food                                GBS:   Contraception  Pap: 06/23/2014 NILM [ ]  repeat due postpartum  CBB     Support Person          Obesity (BMI 30-39.9) 03/09/2017 by Oswaldo Conroy, CNM 03/16/2017 by Oswaldo Conroy, CNM     Preterm labor symptoms and general obstetric precautions including but not limited to vaginal bleeding, contractions, leaking of fluid and fetal movement were reviewed in detail with the patient. Please refer to After Visit Summary for other counseling recommendations.   - long discussion about insulin vs glyburide.  I recommend change over to insulin. She would like to increase glyburide dosing and try that first. Will see how she does over next several-day interval.  Low overall threshold to switch to insulin.  Return in about 5 days (around 10/02/2017) for Routine prenatal/NST.  [name], MD, [facility], [facility] 09/27/2017 12:30 PM

## 2017-09-27 NOTE — Progress Notes (Deleted)
Routine Prenatal Care Visit  Subjective  Carla [name] is a 27 y.o. [name] at [redacted]w[redacted]d being seen today for ongoing prenatal care.  She is currently monitored for the following issues for this {Blank single:19197::"high-risk","low-risk"} pregnancy and has Supervision of high risk pregnancy, antepartum; Obesity affecting pregnancy, antepartum; BMI 40.0-44.9, adult (HCC); Hypothyroid in pregnancy, antepartum; and Gestational diabetes on their problem list.  ----------------------------------------------------------------------------------- Patient reports {sx:14538}.   Contractions: Not present. Vag. Bleeding: None.  Movement: Present. Denies leaking of fluid.  ----------------------------------------------------------------------------------- The following portions of the patient's history were reviewed and updated as appropriate: allergies, current medications, past family history, past medical history, past social history, past surgical history and problem list. Problem list updated.   Objective  Blood pressure 128/84, weight 246 lb (111.6 kg), last menstrual period 11/12/2016. Pregravid weight 220 lb (99.8 kg) Total Weight Gain 26 lb (11.8 kg) Urinalysis:      Fetal Status: Fetal Heart Rate (bpm): 150   Movement: Present     General:  Alert, oriented and cooperative. Patient is in no acute distress.  Skin: Skin is warm and dry. No rash noted.   Cardiovascular: Normal heart rate noted  Respiratory: Normal respiratory effort, no problems with respiration noted  Abdomen: Soft, gravid, appropriate for gestational age. Pain/Pressure: Absent     Pelvic:  {Blank single:19197::"Cervical exam performed","Cervical exam deferred"}        Extremities: Normal range of motion.  Edema: None  Mental Status: Normal mood and affect. Normal behavior. Normal judgment and thought content.   Assessment   27 y.o. [name] at [redacted]w[redacted]d by  11/08/2017, by Ultrasound presenting for {Blank  single:19197::"routine","work-in"} prenatal visit  Plan   Pregnancy#2 Problems (from 11/12/16 to present)    Problem Noted Resolved   Gestational diabetes 08/24/2017 by Conard Novak, MD No   Overview Addendum 09/18/2017  8:51 AM by Conard Novak, MD    Current Diabetic Medications:  Glyburide  - started on glyburide 2.5mg  qHS on 09/11/2016  - increase glyburide 5 mg qHS on 09/18/17  [X]  Diabetes Education and Testing Supplies [X]  Nutrition Counsult [ ]  Fetal ECHO after 22-24 weeks  [ ]  US fetal growth every 4 weeks starting at 28 weeks (30 weeks, 37 %ile) [X]  Twice weekly NST starting at [redacted] weeks gestation [ ]  Delivery planning contingent on fetal growth, AFI, glycemic control, and other co-morbidities but at least by 39 weeks  Baseline and surveillance labs (pulled in from [facility], refresh links as needed)  Lab Results  Component Value Date   TSH 1.570 08/10/2017   No results found for: HGBA1C  Antenatal Testing Class of DM U/S NST/AFI DELIVERY  Diabetes   A1 - good control - O24.410    A2 - good control - O24.419      A2  - poor control or poor compliance - O24.419, E11.65   (Macrosomia or polyhydramnios) **E11.65 is extra code for poor control**    A2/B - O24.919  and B-C O24.319  Poor control B-C or D-R-F-T - O24.319  or  Type I DM - O24.019  20-38  20-38  20-24-28-32-36   20-24-28-32-35-38//fetal echo  20-24-27-30-33-36-38//fetal echo  40  32//2 x wk  32//2 x wk   32//2 x wk  28//BPP wkly then 32//2 x wk  40  39  PRN   39  PRN          Hypothyroid in pregnancy, antepartum 04/13/2017 by Conard Novak, MD No   Overview Addendum 09/11/2017 12:13 PM by  Vena Austria, MD    - initial TSH >3.0, started levothyroxine 50 mcg - recheck, TSH at 12/12 appt. - Normal [x]  recheck with 28 week labs normal       Obesity affecting pregnancy, antepartum 03/16/2017 by Oswaldo Conroy, CNM No   BMI 40.0-44.9, adult (HCC) 03/16/2017 by  Oswaldo Conroy, CNM No   Overview Addendum 08/10/2017  9:59 AM by Conard Novak, MD    BMI >=40 [x]  early 1h gtt -  [x]  u/s for dating [x]   [x]  nutritional goals [x]  folic acid 1mg  [x]  bASA (>12 weeks) [x]  consider nutrition consult [x]  consider maternal EKG 1st trimester [ ]  Growth u/s 28 [x] , 32 [ ] , 36 weeks [ ]  [ ]  NST/AFI weekly 36+ weeks (36[] , 37[] , 38[] , 39[] , 40[] ) [ ]  IOL by 41 weeks (scheduled, prn [] )       Supervision of high risk pregnancy, antepartum 03/09/2017 by Oswaldo Conroy, CNM No   Overview Addendum 09/11/2017 12:13 PM by Vena Austria, MD    Clinic [facility] Prenatal Labs  Dating 6 week [place] Blood type: O Positive O/Positive/-- (10/11 1021)   Genetic Screen Declines all Antibody:Negative (10/11 1021)  Anatomic [place] Normal female  Rubella: Immune 13.80 (10/11 1021) Varicella:  Non-immune  GTT Early: 129    Third trimester: 185  3 hour 101, 192, 166, 156 RPR: Non Reactive (10/11 1021) NR  Rhogam N/A HBsAg: Negative (10/11 1021) Neg  TDaP vaccine 09/11/2017    HIV:   NR  Baby Food                                GBS:   Contraception  Pap: 06/23/2014 NILM [ ]  repeat due postpartum  CBB     Support Person          Obesity (BMI 30-39.9) 03/09/2017 by Oswaldo Conroy, CNM 03/16/2017 by Oswaldo Conroy, CNM       {Blank single:19197::"Term","Preterm"} labor symptoms and general obstetric precautions including but not limited to vaginal bleeding, contractions, leaking of fluid and fetal movement were reviewed in detail with the patient. Please refer to After Visit Summary for other counseling recommendations.   Return in about 5 days (around 10/02/2017) for Routine prenatal/NST.  [name], MD, [facility], [facility] 09/27/2017 12:30 PM  No vb. No lof. AFI/NST today

## 2017-09-27 NOTE — Progress Notes (Incomplete)
Routine Prenatal Care Visit  Subjective  Carla [name] is a 27 y.o. [name] at [redacted]w[redacted]d being seen today for ongoing prenatal care.  She is currently monitored for the following issues for this {Blank single:19197::"high-risk","low-risk"} pregnancy and has Supervision of high risk pregnancy, antepartum; Obesity affecting pregnancy, antepartum; BMI 40.0-44.9, adult (HCC); Hypothyroid in pregnancy, antepartum; and Gestational diabetes on their problem list.  ----------------------------------------------------------------------------------- Patient reports {sx:14538}.   Contractions: Not present. Vag. Bleeding: None.  Movement: Present. Denies leaking of fluid.  ----------------------------------------------------------------------------------- The following portions of the patient's history were reviewed and updated as appropriate: allergies, current medications, past family history, past medical history, past social history, past surgical history and problem list. Problem list updated.   Objective  Blood pressure 128/84, weight 246 lb (111.6 kg), last menstrual period 11/12/2016. Pregravid weight 220 lb (99.8 kg) Total Weight Gain 26 lb (11.8 kg) Urinalysis:      Fetal Status: Fetal Heart Rate (bpm): 150   Movement: Present     General:  Alert, oriented and cooperative. Patient is in no acute distress.  Skin: Skin is warm and dry. No rash noted.   Cardiovascular: Normal heart rate noted  Respiratory: Normal respiratory effort, no problems with respiration noted  Abdomen: Soft, gravid, appropriate for gestational age. Pain/Pressure: Absent     Pelvic:  {Blank single:19197::"Cervical exam performed","Cervical exam deferred"}        Extremities: Normal range of motion.  Edema: None  Mental Status: Normal mood and affect. Normal behavior. Normal judgment and thought content.   Assessment   27 y.o. [name] at [redacted]w[redacted]d by  11/08/2017, by Ultrasound presenting for {Blank  single:19197::"routine","work-in"} prenatal visit  Plan   Pregnancy#2 Problems (from 11/12/16 to present)    Problem Noted Resolved   Gestational diabetes 08/24/2017 by Conard Novak, MD No   Overview Addendum 09/18/2017  8:51 AM by Conard Novak, MD    Current Diabetic Medications:  Glyburide  - started on glyburide 2.5mg  qHS on 09/11/2016  - increase glyburide 5 mg qHS on 09/18/17  [X]  Diabetes Education and Testing Supplies [X]  Nutrition Counsult [ ]  Fetal ECHO after 22-24 weeks  [ ]  US fetal growth every 4 weeks starting at 28 weeks (30 weeks, 37 %ile) [X]  Twice weekly NST starting at [redacted] weeks gestation [ ]  Delivery planning contingent on fetal growth, AFI, glycemic control, and other co-morbidities but at least by 39 weeks  Baseline and surveillance labs (pulled in from [facility], refresh links as needed)  Lab Results  Component Value Date   TSH 1.570 08/10/2017   No results found for: HGBA1C  Antenatal Testing Class of DM U/S NST/AFI DELIVERY  Diabetes   A1 - good control - O24.410    A2 - good control - O24.419      A2  - poor control or poor compliance - O24.419, E11.65   (Macrosomia or polyhydramnios) **E11.65 is extra code for poor control**    A2/B - O24.919  and B-C O24.319  Poor control B-C or D-R-F-T - O24.319  or  Type I DM - O24.019  20-38  20-38  20-24-28-32-36   20-24-28-32-35-38//fetal echo  20-24-27-30-33-36-38//fetal echo  40  32//2 x wk  32//2 x wk   32//2 x wk  28//BPP wkly then 32//2 x wk  40  39  PRN   39  PRN          Hypothyroid in pregnancy, antepartum 04/13/2017 by Conard Novak, MD No   Overview Addendum 09/11/2017 12:13 PM by  Vena Austria, MD    - initial TSH >3.0, started levothyroxine 50 mcg - recheck, TSH at 12/12 appt. - Normal [x]  recheck with 28 week labs normal       Obesity affecting pregnancy, antepartum 03/16/2017 by Oswaldo Conroy, CNM No   BMI 40.0-44.9, adult (HCC) 03/16/2017 by  Oswaldo Conroy, CNM No   Overview Addendum 08/10/2017  9:59 AM by Conard Novak, MD    BMI >=40 [x]  early 1h gtt -  [x]  u/s for dating [x]   [x]  nutritional goals [x]  folic acid 1mg  [x]  bASA (>12 weeks) [x]  consider nutrition consult [x]  consider maternal EKG 1st trimester [ ]  Growth u/s 28 [x] , 32 [ ] , 36 weeks [ ]  [ ]  NST/AFI weekly 36+ weeks (36[] , 37[] , 38[] , 39[] , 40[] ) [ ]  IOL by 41 weeks (scheduled, prn [] )       Supervision of high risk pregnancy, antepartum 03/09/2017 by Oswaldo Conroy, CNM No   Overview Addendum 09/11/2017 12:13 PM by Vena Austria, MD    [facility]inic [facility] Prenatal Labs  Dating 6 week [place] Blood type: O Positive O/Positive/-- (10/11 1021)   Genetic Screen Declines all Antibody:Negative (10/11 1021)  Anatomic [place] Normal female  Rubella: Immune 13.80 (10/11 1021) Varicella:  Non-immune  GTT Early: 129    Third trimester: 185  3 hour 101, 192, 166, 156 RPR: Non Reactive (10/11 1021) NR  Rhogam N/A HBsAg: Negative (10/11 1021) Neg  TDaP vaccine 09/11/2017    HIV:   NR  Baby Food                                GBS:   Contraception  Pap: 06/23/2014 NILM [ ]  repeat due postpartum  CBB     Support Person          Obesity (BMI 30-39.9) 03/09/2017 by Oswaldo Conroy, CNM 03/16/2017 by Oswaldo Conroy, CNM       {Blank single:19197::"Term","Preterm"} labor symptoms and general obstetric precautions including but not limited to vaginal bleeding, contractions, leaking of fluid and fetal movement were reviewed in detail with the patient. Please refer to After Visit Summary for other counseling recommendations.   Return in about 5 days (around 10/02/2017) for Routine prenatal/NST.  [name], MD, [facility], [facility] 09/27/2017 12:30 PM  No vb. No lof. AFI/NST today

## 2017-09-28 MED ORDER — LEVOTHYROXINE SODIUM 50 MCG PO TABS: 50.0000 ug | ORAL_TABLET | Freq: Every day | ORAL | 5 refills | Status: DC

## 2017-09-28 MED ORDER — GLYBURIDE 5 MG PO TABS: 5.0000 mg | ORAL_TABLET | Freq: Two times a day (BID) | ORAL | 1 refills | Status: DC

## 2017-10-02 ENCOUNTER — Encounter: Payer: Self-pay | Admitting: Maternal Newborn

## 2017-10-02 ENCOUNTER — Ambulatory Visit (INDEPENDENT_AMBULATORY_CARE_PROVIDER_SITE_OTHER): Payer: BC Managed Care – PPO | Admitting: Maternal Newborn

## 2017-10-02 VITALS — BP 120/60 | Wt 249.0 lb

## 2017-10-02 DIAGNOSIS — O0990 Supervision of high risk pregnancy, unspecified, unspecified trimester: Secondary | ICD-10-CM | POA: Diagnosis not present

## 2017-10-02 DIAGNOSIS — Z369 Encounter for antenatal screening, unspecified: Secondary | ICD-10-CM

## 2017-10-02 DIAGNOSIS — Z3A34 34 weeks gestation of pregnancy: Secondary | ICD-10-CM

## 2017-10-02 DIAGNOSIS — O24415 Gestational diabetes mellitus in pregnancy, controlled by oral hypoglycemic drugs: Secondary | ICD-10-CM

## 2017-10-02 LAB — FETAL NONSTRESS TEST

## 2017-10-02 NOTE — Progress Notes (Signed)
C/o allergies - adv claritin, zyrtec, benadryl sparingly.rj

## 2017-10-02 NOTE — Progress Notes (Signed)
Routine Prenatal Care Visit  Subjective  Carla [name] is a 27 y.o. [name] at [redacted]w[redacted]d being seen today for ongoing prenatal care.  She is currently monitored for the following issues for this high-risk pregnancy and has Supervision of high risk pregnancy, antepartum; Obesity affecting pregnancy, antepartum; BMI 40.0-44.9, adult (HCC); Hypothyroid in pregnancy, antepartum; and Gestational diabetes on their problem list.  ----------------------------------------------------------------------------------- Patient reports seasonal allergy symptoms.  Glucose logs show fasting values 87-101; postprandial values are >50% normal but still 5 elevated readings; two hypoglycemic readings postprandial (64 and 68). She was dizzy/shaky with the first low sugar on 4/27 but felt OK today when the low reading occurred. Contractions: Not present. Vag. Bleeding: None.  Movement: Present. Denies leaking of fluid.  ----------------------------------------------------------------------------------- The following portions of the patient's history were reviewed and updated as appropriate: allergies, current medications, past family history, past medical history, past social history, past surgical history and problem list. Problem list updated.   Objective  Blood pressure 120/60, weight 249 lb (112.9 kg), last menstrual period 11/12/2016. Pregravid weight 220 lb (99.8 kg) Total Weight Gain 29 lb (13.2 kg) Urinalysis: Urine Protein: Negative Urine Glucose: Negative  Fetal Status: Fetal Heart Rate (bpm): 152 Fundal Height: 34 cm Movement: Present     General:  Alert, oriented and cooperative. Patient is in no acute distress.  Skin: Skin is warm and dry. No rash noted.   Cardiovascular: Normal heart rate noted  Respiratory: Normal respiratory effort, no problems with respiration noted  Abdomen: Soft, gravid, appropriate for gestational age. Pain/Pressure: Absent     Pelvic:  Cervical exam deferred          Extremities: Normal range of motion.  Edema: None  Mental Status: Normal mood and affect. Normal behavior. Normal judgment and thought content.   NST Baseline: 145 Variability: moderate Accelerations: present Decelerations: absent Tocometry: none The patient was monitored for 20+ minutes, fetal heart rate tracing was deemed reactive, category I tracing.  Assessment   26 y.o. [name] at [redacted]w[redacted]d, EDD 11/08/2017 by Ultrasound presenting for routine prenatal visit.  Plan   Pregnancy#2 Problems (from 11/12/16 to present)    Problem Noted Resolved   Gestational diabetes 08/24/2017 by Conard Novak, MD No   Overview Addendum 09/18/2017  8:51 AM by Conard Novak, MD    Current Diabetic Medications:  Glyburide  - started on glyburide 2.5mg  qHS on 09/11/2016  - increase glyburide 5 mg qHS on 09/18/17  [X]  Diabetes Education and Testing Supplies [X]  Nutrition Counsult [ ]  Fetal ECHO after 22-24 weeks  [ ]  US fetal growth every 4 weeks starting at 28 weeks (30 weeks, 37 %ile) [X]  Twice weekly NST starting at [redacted] weeks gestation [ ]  Delivery planning contingent on fetal growth, AFI, glycemic control, and other co-morbidities but at least by 39 weeks  Baseline and surveillance labs (pulled in from [facility], refresh links as needed)  Lab Results  Component Value Date   TSH 1.570 08/10/2017   No results found for: HGBA1C  Antenatal Testing Class of DM U/S NST/AFI DELIVERY  Diabetes   A1 - good control - O24.410    A2 - good control - O24.419      A2  - poor control or poor compliance - O24.419, E11.65   (Macrosomia or polyhydramnios) **E11.65 is extra code for poor control**    A2/B - O24.919  and B-C O24.319  Poor control B-C or D-R-F-T - O24.319  or  Type I DM - O24.019  20-38  20-38  20-24-28-32-36   20-24-28-32-35-38//fetal echo  20-24-27-30-33-36-38//fetal echo  40  32//2 x wk  32//2 x wk   32//2 x wk  28//BPP wkly then 32//2 x wk   40  39  PRN   39  PRN          Hypothyroid in pregnancy, antepartum 04/13/2017 by Conard Novak, MD No   Overview Addendum 09/11/2017 12:13 PM by Vena Austria, MD    - initial TSH >3.0, started levothyroxine 50 mcg - recheck, TSH at 12/12 appt. - Normal [x]  recheck with 28 week labs normal       Obesity affecting pregnancy, antepartum 03/16/2017 by Oswaldo Conroy, CNM No   BMI 40.0-44.9, adult (HCC) 03/16/2017 by Oswaldo Conroy, CNM No   Overview Addendum 08/10/2017  9:59 AM by Conard Novak, MD    BMI >=40 [x]  early 1h gtt -  [x]  u/s for dating [x]   [x]  nutritional goals [x]  folic acid 1mg  [x]  bASA (>12 weeks) [x]  consider nutrition consult [x]  consider maternal EKG 1st trimester [ ]  Growth u/s 28 [x] , 32 [ ] , 36 weeks [ ]  [ ]  NST/AFI weekly 36+ weeks (36[] , 37[] , 38[] , 39[] , 40[] ) [ ]  IOL by 41 weeks (scheduled, prn [] )       Supervision of high risk pregnancy, antepartum 03/09/2017 by Oswaldo Conroy, CNM No   Overview Addendum 09/11/2017 12:13 PM by Vena Austria, MD    Clinic [facility] Prenatal Labs  Dating 6 week [place] Blood type: O Positive O/Positive/-- (10/11 1021)   Genetic Screen Declines all Antibody:Negative (10/11 1021)  Anatomic [place] Normal female  Rubella: Immune 13.80 (10/11 1021) Varicella:  Non-immune  GTT Early: 129    Third trimester: 185  3 hour 101, 192, 166, 156 RPR: Non Reactive (10/11 1021) NR  Rhogam N/A HBsAg: Negative (10/11 1021) Neg  TDaP vaccine 09/11/2017    HIV:   NR  Baby Food                                GBS:   Contraception  Pap: 06/23/2014 NILM [ ]  repeat due postpartum  CBB     Support Person          Obesity (BMI 30-39.9) 03/09/2017 by Oswaldo Conroy, CNM 03/16/2017 by Oswaldo Conroy, CNM      NST reactive today. Discussed use of OTC medications such as Claritin and saline nose drops to help relieve seasonal allergy symptoms.  Glucose logs reviewed with MD. Need a few more days of data to  determine if glyburide will achieve adequate control vs. changing to insulin therapy.  General obstetric precautions including but not limited to vaginal bleeding, contractions, leaking of fluid and fetal movement were reviewed.  Has appointment scheduled on 5/2 for ROB with NST/AFI.  Return in about 1 week (around 10/09/2017) for ROB/NST.  Marcelyn Bruins, CNM 10/02/2017  5:08 PM

## 2017-10-05 ENCOUNTER — Ambulatory Visit (INDEPENDENT_AMBULATORY_CARE_PROVIDER_SITE_OTHER): Payer: BC Managed Care – PPO | Admitting: Obstetrics & Gynecology

## 2017-10-05 ENCOUNTER — Telehealth: Payer: Self-pay

## 2017-10-05 ENCOUNTER — Ambulatory Visit (INDEPENDENT_AMBULATORY_CARE_PROVIDER_SITE_OTHER): Payer: BC Managed Care – PPO

## 2017-10-05 ENCOUNTER — Other Ambulatory Visit: Payer: Self-pay | Admitting: Obstetrics and Gynecology

## 2017-10-05 VITALS — BP 120/80 | Wt 244.0 lb

## 2017-10-05 DIAGNOSIS — O99280 Endocrine, nutritional and metabolic diseases complicating pregnancy, unspecified trimester: Secondary | ICD-10-CM

## 2017-10-05 DIAGNOSIS — O24419 Gestational diabetes mellitus in pregnancy, unspecified control: Secondary | ICD-10-CM

## 2017-10-05 DIAGNOSIS — Z3A35 35 weeks gestation of pregnancy: Secondary | ICD-10-CM | POA: Diagnosis not present

## 2017-10-05 DIAGNOSIS — O0990 Supervision of high risk pregnancy, unspecified, unspecified trimester: Secondary | ICD-10-CM

## 2017-10-05 DIAGNOSIS — O24415 Gestational diabetes mellitus in pregnancy, controlled by oral hypoglycemic drugs: Secondary | ICD-10-CM

## 2017-10-05 DIAGNOSIS — E039 Hypothyroidism, unspecified: Secondary | ICD-10-CM | POA: Diagnosis not present

## 2017-10-05 DIAGNOSIS — O99210 Obesity complicating pregnancy, unspecified trimester: Secondary | ICD-10-CM

## 2017-10-05 DIAGNOSIS — Z6841 Body Mass Index (BMI) 40.0 and over, adult: Secondary | ICD-10-CM | POA: Diagnosis not present

## 2017-10-05 NOTE — Telephone Encounter (Signed)
FMLA/DISABILITY form for [employer] filled out, signature obtained, and given to [place] for processing. 

## 2017-10-05 NOTE — Progress Notes (Signed)
  Subjective  Fetal Movement? yes Contractions? Rare BH Leaking Fluid? no Vaginal Bleeding? no FBS <90, 2 hour PPBS 80-120 Objective  BP 120/80   Wt 244 lb (110.7 kg)   LMP 11/12/2016 (Exact Date)   BMI 44.63 kg/m  General: NAD Pumonary: no increased work of breathing Abdomen: gravid, non-tender Extremities: no edema Psychiatric: mood appropriate, affect full  Assessment  26 y.o. [name] at [redacted]w[redacted]d by  11/08/2017, by Ultrasound presenting for routine prenatal visit  Plan   Problem List Items Addressed This Visit      Endocrine   Hypothyroid in pregnancy, antepartum   Gestational diabetes     Other   Supervision of high risk pregnancy, antepartum   Obesity affecting pregnancy, antepartum    Other Visit Diagnoses    [redacted] weeks gestation of pregnancy    -  Primary    A NST procedure was performed with FHR monitoring and a normal baseline established, appropriate time of 20-40 minutes of evaluation, and accels >2 seen w 15x15 characteristics.  Results show a REACTIVE NST.   Review of ULTRASOUND.    I have personally reviewed images and report of recent ultrasound done at [facility].    Plan of management to be discussed with patient.  APT twice weekly; IOL 39 weeks discussed (not scheduled)  Annamarie Major, MD, [facility], [facility] 10/05/2017  9:48 AM

## 2017-10-09 ENCOUNTER — Ambulatory Visit (INDEPENDENT_AMBULATORY_CARE_PROVIDER_SITE_OTHER): Payer: BC Managed Care – PPO | Admitting: Obstetrics and Gynecology

## 2017-10-09 ENCOUNTER — Encounter: Payer: Self-pay | Admitting: Obstetrics and Gynecology

## 2017-10-09 VITALS — BP 122/74 | Wt 246.0 lb

## 2017-10-09 DIAGNOSIS — O99210 Obesity complicating pregnancy, unspecified trimester: Secondary | ICD-10-CM

## 2017-10-09 DIAGNOSIS — O99280 Endocrine, nutritional and metabolic diseases complicating pregnancy, unspecified trimester: Secondary | ICD-10-CM

## 2017-10-09 DIAGNOSIS — O0990 Supervision of high risk pregnancy, unspecified, unspecified trimester: Secondary | ICD-10-CM

## 2017-10-09 DIAGNOSIS — O24415 Gestational diabetes mellitus in pregnancy, controlled by oral hypoglycemic drugs: Secondary | ICD-10-CM

## 2017-10-09 DIAGNOSIS — Z3A35 35 weeks gestation of pregnancy: Secondary | ICD-10-CM

## 2017-10-09 DIAGNOSIS — Z6841 Body Mass Index (BMI) 40.0 and over, adult: Secondary | ICD-10-CM

## 2017-10-09 DIAGNOSIS — E039 Hypothyroidism, unspecified: Secondary | ICD-10-CM

## 2017-10-09 NOTE — Progress Notes (Signed)
Routine Prenatal Care Visit  Subjective  Carla [name] is a 27 y.o. [name] at [redacted]w[redacted]d being seen today for ongoing prenatal care.  She is currently monitored for the following issues for this high-risk pregnancy and has Supervision of high risk pregnancy, antepartum; Obesity affecting pregnancy, antepartum; BMI 40.0-44.9, adult (HCC); Hypothyroid in pregnancy, antepartum; and Gestational diabetes on their problem list.  ----------------------------------------------------------------------------------- Patient reports no complaints.   Contractions: Not present. Vag. Bleeding: None.  Movement: Present. Denies leaking of fluid.   Glucose Log Fasting: 100, 101, 99, 93, 101, 87, 122, 123, 91, 101, 100, 85, 95,  2hr Breakfast:117 99 112 95 128 133 107 123 65 121  99 115 2hr Lunch: 102 78 129 77 64 86 80 73 95 92  95  2hr Dinner: 101 11 128 137 112 94 130 115 136 118  93  ----------------------------------------------------------------------------------- The following portions of the patient's history were reviewed and updated as appropriate: allergies, current medications, past family history, past medical history, past social history, past surgical history and problem list. Problem list updated.   Objective  Blood pressure 122/74, weight 246 lb (111.6 kg), last menstrual period 11/12/2016. Pregravid weight 220 lb (99.8 kg) Total Weight Gain 26 lb (11.8 kg) Urinalysis:      Fetal Status:     Movement: Present     General:  Alert, oriented and cooperative. Patient is in no acute distress.  Skin: Skin is warm and dry. No rash noted.   Cardiovascular: Normal heart rate noted  Respiratory: Normal respiratory effort, no problems with respiration noted  Abdomen: Soft, gravid, appropriate for gestational age. Pain/Pressure: Absent     Pelvic:  Cervical exam deferred        Extremities: Normal range of motion.     ental Status: Normal mood and affect. Normal behavior. Normal judgment and thought  content.   NST: Reactive, category I tracing, 150 baseline, 15x15 accelerations more than 2, nor decelerations, moderate variability.  Assessment   27 y.o. [name] at [redacted]w[redacted]d by  11/08/2017, by Ultrasound presenting for routine prenatal visit  Plan   Pregnancy#2 Problems (from 11/12/16 to present)    Problem Noted Resolved   Gestational diabetes 08/24/2017 by Conard Novak, MD No   Overview Addendum 10/09/2017 10:24 AM by Natale Milch, MD    Current Diabetic Medications:  Glyburide  - started on glyburide 2.5mg  qHS on 09/11/2016  - increase glyburide 5 mg qHS on 09/18/17 - increased to 5 mg BID on 09/29/17 -increased to 5mg  Breakfast, 10mg  at dinner on 10/09/17  [X]  Diabetes Education and Testing Supplies [X]  Nutrition Counsult [ ]  Fetal ECHO after 22-24 weeks  [ ]  US fetal growth every 4 weeks starting at 28 weeks (30 weeks, 37 %ile) [X]  Twice weekly NST starting at [redacted] weeks gestation [ ]  Delivery planning contingent on fetal growth, AFI, glycemic control, and other co-morbidities but at least by 39 weeks  Baseline and surveillance labs (pulled in from [facility], refresh links as needed)  Lab Results  Component Value Date   TSH 1.570 08/10/2017   No results found for: HGBA1C  Antenatal Testing Class of DM U/S NST/AFI DELIVERY  Diabetes   A1 - good control - O24.410    A2 - good control - O24.419      A2  - poor control or poor compliance - O24.419, E11.65   (Macrosomia or polyhydramnios) **E11.65 is extra code for poor control**    A2/B - O24.919  and B-C O24.319  Poor control  B-C or D-R-F-T - O24.319  or  Type I DM - O24.019  20-38  20-38  20-24-28-32-36   20-24-28-32-35-38//fetal echo  20-24-27-30-33-36-38//fetal echo  40  32//2 x wk  32//2 x wk   32//2 x wk  28//BPP wkly then 32//2 x wk  40  39  PRN   39  PRN          Hypothyroid in pregnancy, antepartum 04/13/2017 by Conard Novak, MD No   Overview Addendum 09/11/2017 12:13 PM by  Vena Austria, MD    - initial TSH >3.0, started levothyroxine 50 mcg - recheck, TSH at 12/12 appt. - Normal [x]  recheck with 28 week labs normal       Obesity affecting pregnancy, antepartum 03/16/2017 by Oswaldo Conroy, CNM No   BMI 40.0-44.9, adult (HCC) 03/16/2017 by Oswaldo Conroy, CNM No   Overview Addendum 08/10/2017  9:59 AM by Conard Novak, MD    BMI >=40 [x]  early 1h gtt -  [x]  u/s for dating [x]   [x]  nutritional goals [x]  folic acid 1mg  [x]  bASA (>12 weeks) [x]  consider nutrition consult [x]  consider maternal EKG 1st trimester [ ]  Growth u/s 28 [x] , 32 [ ] , 36 weeks [ ]  [ ]  NST/AFI weekly 36+ weeks (36[] , 37[] , 38[] , 39[] , 40[] ) [ ]  IOL by 41 weeks (scheduled, prn [] )       Supervision of high risk pregnancy, antepartum 03/09/2017 by Oswaldo Conroy, CNM No   Overview Addendum 10/09/2017 10:25 AM by Natale Milch, MD    Clinic [facility] Prenatal Labs  Dating 6 week [place] Blood type: O Positive O/Positive/-- (10/11 1021)   Genetic Screen Declines all Antibody:Negative (10/11 1021)  Anatomic [place] Normal female  Rubella: Immune 13.80 (10/11 1021) Varicella:  Non-immune  GTT Early: 129    Third trimester: 185  3 hour 101, 192, 166, 156 RPR: Non Reactive (10/11 1021) NR  Rhogam N/A HBsAg: Negative (10/11 1021) Neg  TDaP vaccine 09/11/2017    HIV:   NR  Baby Food Breast                               GBS:   Contraception Progesterone pill Pap: 06/23/2014 NILM [ ]  repeat due postpartum  CBB   Given information   Support Person          Obesity (BMI 30-39.9) 03/09/2017 by Oswaldo Conroy, CNM 03/16/2017 by Oswaldo Conroy, CNM       Gestational age appropriate obstetric precautions including but not limited to vaginal bleeding, contractions, leaking of fluid and fetal movement were reviewed in detail with the patient.    Return for ROB with twice weekly NST and once weekly NST/AFI .  Increased glyburide to 5mg  AM, 10mg  PM because of persistant  elevated fasting glucose levels Growth and AFI [place] at next visit. Will need GBS and GC/CT at next visit. Given information on prenatal classes with [facility].  Given information on birthcontrol options postpartum. Recommended [email or web address].  Given information on cord blood banking. Given information on volunteer doula program at the hospital.  Discussed breast feeding. Patient is planning on breast feeding. Patient was given resources and lactation consultation was advised. She reports last pregnancy she only breast fed for 1 month.   Adelene Idler MD [facility], [facility] 10/09/17 10:00 AM

## 2017-10-09 NOTE — Progress Notes (Signed)
No vb no lof. NST today.

## 2017-10-12 ENCOUNTER — Encounter: Payer: Self-pay | Admitting: Dietician

## 2017-10-12 ENCOUNTER — Encounter: Payer: Self-pay | Admitting: Advanced Practice Midwife

## 2017-10-12 ENCOUNTER — Ambulatory Visit (INDEPENDENT_AMBULATORY_CARE_PROVIDER_SITE_OTHER): Payer: BC Managed Care – PPO | Admitting: Advanced Practice Midwife

## 2017-10-12 ENCOUNTER — Ambulatory Visit (INDEPENDENT_AMBULATORY_CARE_PROVIDER_SITE_OTHER): Payer: BC Managed Care – PPO

## 2017-10-12 VITALS — BP 126/78 | Wt 250.0 lb

## 2017-10-12 DIAGNOSIS — O0990 Supervision of high risk pregnancy, unspecified, unspecified trimester: Secondary | ICD-10-CM

## 2017-10-12 DIAGNOSIS — Z113 Encounter for screening for infections with a predominantly sexual mode of transmission: Secondary | ICD-10-CM

## 2017-10-12 DIAGNOSIS — Z369 Encounter for antenatal screening, unspecified: Secondary | ICD-10-CM

## 2017-10-12 DIAGNOSIS — Z3A36 36 weeks gestation of pregnancy: Secondary | ICD-10-CM

## 2017-10-12 DIAGNOSIS — Z3685 Encounter for antenatal screening for Streptococcus B: Secondary | ICD-10-CM

## 2017-10-12 LAB — FETAL NONSTRESS TEST

## 2017-10-12 NOTE — Patient Instructions (Signed)
Vaginal Delivery Vaginal delivery means that you will give birth by pushing your baby out of your birth canal (vagina). A team of health care providers will help you before, during, and after vaginal delivery. Birth experiences are unique for every woman and every pregnancy, and birth experiences vary depending on where you choose to give birth. What should I do to prepare for my baby's birth? Before your baby is born, it is important to talk with your health care provider about:  Your labor and delivery preferences. These may include: ? Medicines that you may be given. ? How you will manage your pain. This might include non-medical pain relief techniques or injectable pain relief such as epidural analgesia. ? How you and your baby will be monitored during labor and delivery. ? Who may be in the labor and delivery room with you. ? Your feelings about surgical delivery of your baby (cesarean delivery, or C-section) if this becomes necessary. ? Your feelings about receiving donated blood through an IV tube (blood transfusion) if this becomes necessary.  Whether you are able: ? To take pictures or videos of the birth. ? To eat during labor and delivery. ? To move around, walk, or change positions during labor and delivery.  What to expect after your baby is born, such as: ? Whether delayed umbilical cord clamping and cutting is offered. ? Who will care for your baby right after birth. ? Medicines or tests that may be recommended for your baby. ? Whether breastfeeding is supported in your hospital or birth center. ? How long you will be in the hospital or birth center.  How any medical conditions you have may affect your baby or your labor and delivery experience.  To prepare for your baby's birth, you should also:  Attend all of your health care visits before delivery (prenatal visits) as recommended by your health care provider. This is important.  Prepare your home for your baby's  arrival. Make sure that you have: ? Diapers. ? Baby clothing. ? Feeding equipment. ? Safe sleeping arrangements for you and your baby.  Install a car seat in your vehicle. Have your car seat checked by a certified car seat installer to make sure that it is installed safely.  Think about who will help you with your new baby at home for at least the first several weeks after delivery.  What can I expect when I arrive at the birth center or hospital? Once you are in labor and have been admitted into the hospital or birth center, your health care provider may:  Review your pregnancy history and any concerns you have.  Insert an IV tube into one of your veins. This is used to give you fluids and medicines.  Check your blood pressure, pulse, temperature, and heart rate (vital signs).  Check whether your bag of water (amniotic sac) has broken (ruptured).  Talk with you about your birth plan and discuss pain control options.  Monitoring Your health care provider may monitor your contractions (uterine monitoring) and your baby's heart rate (fetal monitoring). You may need to be monitored:  Often, but not continuously (intermittently).  All the time or for long periods at a time (continuously). Continuous monitoring may be needed if: ? You are taking certain medicines, such as medicine to relieve pain or make your contractions stronger. ? You have pregnancy or labor complications.  Monitoring may be done by:  Placing a special stethoscope or a handheld monitoring device on your abdomen to   check your baby's heartbeat, and feeling your abdomen for contractions. This method of monitoring does not continuously record your baby's heartbeat or your contractions.  Placing monitors on your abdomen (external monitors) to record your baby's heartbeat and the frequency and length of contractions. You may not have to wear external monitors all the time.  Placing monitors inside of your uterus  (internal monitors) to record your baby's heartbeat and the frequency, length, and strength of your contractions. ? Your health care provider may use internal monitors if he or she needs more information about the strength of your contractions or your baby's heart rate. ? Internal monitors are put in place by passing a thin, flexible wire through your vagina and into your uterus. Depending on the type of monitor, it may remain in your uterus or on your baby's head until birth. ? Your health care provider will discuss the benefits and risks of internal monitoring with you and will ask for your permission before inserting the monitors.  Telemetry. This is a type of continuous monitoring that can be done with external or internal monitors. Instead of having to stay in bed, you are able to move around during telemetry. Ask your health care provider if telemetry is an option for you.  Physical exam Your health care provider may perform a physical exam. This may include:  Checking whether your baby is positioned: ? With the head toward your vagina (head-down). This is most common. ? With the head toward the top of your uterus (head-up or breech). If your baby is in a breech position, your health care provider may try to turn your baby to a head-down position so you can deliver vaginally. If it does not seem that your baby can be born vaginally, your provider may recommend surgery to deliver your baby. In rare cases, you may be able to deliver vaginally if your baby is head-up (breech delivery). ? Lying sideways (transverse). Babies that are lying sideways cannot be delivered vaginally.  Checking your cervix to determine: ? Whether it is thinning out (effacing). ? Whether it is opening up (dilating). ? How low your baby has moved into your birth canal.  What are the three stages of labor and delivery?  Normal labor and delivery is divided into the following three stages: Stage 1  Stage 1 is the  longest stage of labor, and it can last for hours or days. Stage 1 includes: ? Early labor. This is when contractions may be irregular, or regular and mild. Generally, early labor contractions are more than 10 minutes apart. ? Active labor. This is when contractions get longer, more regular, more frequent, and more intense. ? The transition phase. This is when contractions happen very close together, are very intense, and may last longer than during any other part of labor.  Contractions generally feel mild, infrequent, and irregular at first. They get stronger, more frequent (about every 2-3 minutes), and more regular as you progress from early labor through active labor and transition.  Many women progress through stage 1 naturally, but you may need help to continue making progress. If this happens, your health care provider may talk with you about: ? Rupturing your amniotic sac if it has not ruptured yet. ? Giving you medicine to help make your contractions stronger and more frequent.  Stage 1 ends when your cervix is completely dilated to 4 inches (10 cm) and completely effaced. This happens at the end of the transition phase. Stage 2  Once   your cervix is completely effaced and dilated to 4 inches (10 cm), you may start to feel an urge to push. It is common for the body to naturally take a rest before feeling the urge to push, especially if you received an epidural or certain other pain medicines. This rest period may last for up to 1-2 hours, depending on your unique labor experience.  During stage 2, contractions are generally less painful, because pushing helps relieve contraction pain. Instead of contraction pain, you may feel stretching and burning pain, especially when the widest part of your baby's head passes through the vaginal opening (crowning).  Your health care provider will closely monitor your pushing progress and your baby's progress through the vagina during stage 2.  Your  health care provider may massage the area of skin between your vaginal opening and anus (perineum) or apply warm compresses to your perineum. This helps it stretch as the baby's head starts to crown, which can help prevent perineal tearing. ? In some cases, an incision may be made in your perineum (episiotomy) to allow the baby to pass through the vaginal opening. An episiotomy helps to make the opening of the vagina larger to allow more room for the baby to fit through.  It is very important to breathe and focus so your health care provider can control the delivery of your baby's head. Your health care provider may have you decrease the intensity of your pushing, to help prevent perineal tearing.  After delivery of your baby's head, the shoulders and the rest of the body generally deliver very quickly and without difficulty.  Once your baby is delivered, the umbilical cord may be cut right away, or this may be delayed for 1-2 minutes, depending on your baby's health. This may vary among health care providers, hospitals, and birth centers.  If you and your baby are healthy enough, your baby may be placed on your chest or abdomen to help maintain the baby's temperature and to help you bond with each other. Some mothers and babies start breastfeeding at this time. Your health care team will dry your baby and help keep your baby warm during this time.  Your baby may need immediate care if he or she: ? Showed signs of distress during labor. ? Has a medical condition. ? Was born too early (prematurely). ? Had a bowel movement before birth (meconium). ? Shows signs of difficulty transitioning from being inside the uterus to being outside of the uterus. If you are planning to breastfeed, your health care team will help you begin a feeding. Stage 3  The third stage of labor starts immediately after the birth of your baby and ends after you deliver the placenta. The placenta is an organ that develops  during pregnancy to provide oxygen and nutrients to your baby in the womb.  Delivering the placenta may require some pushing, and you may have mild contractions. Breastfeeding can stimulate contractions to help you deliver the placenta.  After the placenta is delivered, your uterus should tighten (contract) and become firm. This helps to stop bleeding in your uterus. To help your uterus contract and to control bleeding, your health care provider may: ? Give you medicine by injection, through an IV tube, by mouth, or through your rectum (rectally). ? Massage your abdomen or perform a vaginal exam to remove any blood clots that are left in your uterus. ? Empty your bladder by placing a thin, flexible tube (catheter) into your bladder. ? Encourage   you to breastfeed your baby. After labor is over, you and your baby will be monitored closely to ensure that you are both healthy until you are ready to go home. Your health care team will teach you how to care for yourself and your baby. This information is not intended to replace advice given to you by your health care provider. Make sure you discuss any questions you have with your health care provider. Document Released: 03/01/2008 Document Revised: 12/11/2015 Document Reviewed: 06/07/2015 Elsevier Interactive Patient Education  2018 [employer]  

## 2017-10-12 NOTE — Progress Notes (Signed)
Patient is seen today for [redacted]w[redacted]d visit. She was supposed to have had growth scan with AFI but only AFI was done. She has no complaints today.   Her blood sugar values are mostly within normal range. She started taking 10 mg glyburide at night on Monday and she takes 5 mg in the morning. She had 1 normal fasting and 2 slightly elevated (95 and 100) fasting levels since then. Her post prandial values are normal. She said she "just ate whatever she wanted" yesterday and that is why her fasting this morning was 100. She is encouraged to decrease sugar/carbohydrates and increase activity level.   NST today is reactive 20 minute tracing. Baseline is 155 bpm, moderate variability, +accelerations, -decelerations. AFI is 11.48 today.    GBS/aptima collected today. Cervix appears closed.   Growth/AFI/NST ordered for next week.  ROB in 1 week is already scheduled.  Tresea Mall, CNM

## 2017-10-12 NOTE — Progress Notes (Signed)
Updated Diabetes Education flowsheet to reflect patient's completion of the gestational diabetes program.

## 2017-10-12 NOTE — Progress Notes (Signed)
AFI/NST today  

## 2017-10-14 LAB — STREP GP B NAA: STREP GROUP B AG: NEGATIVE

## 2017-10-16 ENCOUNTER — Ambulatory Visit (INDEPENDENT_AMBULATORY_CARE_PROVIDER_SITE_OTHER): Payer: BC Managed Care – PPO | Admitting: Obstetrics and Gynecology

## 2017-10-16 ENCOUNTER — Encounter: Payer: Self-pay | Admitting: Obstetrics and Gynecology

## 2017-10-16 VITALS — BP 120/70 | Wt 248.0 lb

## 2017-10-16 DIAGNOSIS — Z6841 Body Mass Index (BMI) 40.0 and over, adult: Secondary | ICD-10-CM | POA: Diagnosis not present

## 2017-10-16 DIAGNOSIS — Z3A36 36 weeks gestation of pregnancy: Secondary | ICD-10-CM

## 2017-10-16 DIAGNOSIS — O99280 Endocrine, nutritional and metabolic diseases complicating pregnancy, unspecified trimester: Secondary | ICD-10-CM

## 2017-10-16 DIAGNOSIS — O0990 Supervision of high risk pregnancy, unspecified, unspecified trimester: Secondary | ICD-10-CM

## 2017-10-16 DIAGNOSIS — O99210 Obesity complicating pregnancy, unspecified trimester: Secondary | ICD-10-CM

## 2017-10-16 DIAGNOSIS — E039 Hypothyroidism, unspecified: Secondary | ICD-10-CM

## 2017-10-16 DIAGNOSIS — O24415 Gestational diabetes mellitus in pregnancy, controlled by oral hypoglycemic drugs: Secondary | ICD-10-CM

## 2017-10-16 LAB — FETAL NONSTRESS TEST

## 2017-10-16 NOTE — Progress Notes (Signed)
Routine Prenatal Care Visit  Subjective  Carla [name] is a 27 y.o. [name] at [redacted]w[redacted]d being seen today for ongoing prenatal care.  She is currently monitored for the following issues for this high-risk pregnancy and has Supervision of high risk pregnancy, antepartum; Obesity affecting pregnancy, antepartum; BMI 40.0-44.9, adult (HCC); Hypothyroid in pregnancy, antepartum; and Gestational diabetes on their problem list.  ----------------------------------------------------------------------------------- Patient reports frontal and sinus area headache. Tylenol and caffeine (coffee) do not help.  The headache has been present for several days. She states she had a similar issue near the end of her last pregnancy.  She denies visual changes and any neurologic symptoms.  Contractions: Not present. Vag. Bleeding: None.  Movement: Present. Denies leaking of fluid.  Recent AFI (last week): 11.5 cm GDM:  Most values normal (well > 50% fasting and 2h pp) ----------------------------------------------------------------------------------- The following portions of the patient's history were reviewed and updated as appropriate: allergies, current medications, past family history, past medical history, past social history, past surgical history and problem list. Problem list updated.  Objective  Blood pressure 120/70, weight 248 lb (112.5 kg), last menstrual period 11/12/2016, unknown if currently breastfeeding. Pregravid weight 220 lb (99.8 kg) Total Weight Gain 28 lb (12.7 kg) Urinalysis: Urine Protein: Negative Urine Glucose: 2+  Fetal Status: Fetal Heart Rate (bpm): 160 Fundal Height: 37 cm Movement: Present     General:  Alert, oriented and cooperative. Patient is in no acute distress.  Skin: Skin is warm and dry. No rash noted.   Cardiovascular: Normal heart rate noted  Respiratory: Normal respiratory effort, no problems with respiration noted  Abdomen: Soft, gravid, appropriate for gestational  age. Pain/Pressure: Absent     Pelvic:  Cervical exam deferred        Extremities: Normal range of motion.  Edema: None  Mental Status: Normal mood and affect. Normal behavior. Normal judgment and thought content.   Assessment   27 y.o. [name] at [redacted]w[redacted]d by  11/08/2017, by Ultrasound presenting for routine prenatal visit  Plan   Pregnancy#2 Problems (from 03/14/17 to present)    Problem Noted Resolved   Gestational diabetes 08/24/2017 by Conard Novak, MD No   Overview Addendum 10/09/2017 10:24 AM by Natale Milch, MD    Current Diabetic Medications:  Glyburide  - started on glyburide 2.5mg  qHS on 09/11/2016  - increase glyburide 5 mg qHS on 09/18/17 - increased to 5 mg BID on 09/29/17 -increased to 5mg  Breakfast, 10mg  at dinner on 10/09/17  [X]  Diabetes Education and Testing Supplies [X]  Nutrition Counsult [ ]  Fetal ECHO after 22-24 weeks  [ ]  US fetal growth every 4 weeks starting at 28 weeks (30 weeks, 37 %ile) [X]  Twice weekly NST starting at [redacted] weeks gestation [ ]  Delivery planning contingent on fetal growth, AFI, glycemic control, and other co-morbidities but at least by 39 weeks  Baseline and surveillance labs (pulled in from [facility], refresh links as needed)  Lab Results  Component Value Date   TSH 1.570 08/10/2017   No results found for: HGBA1C  Antenatal Testing Class of DM U/S NST/AFI DELIVERY  Diabetes   A1 - good control - O24.410    A2 - good control - O24.419      A2  - poor control or poor compliance - O24.419, E11.65   (Macrosomia or polyhydramnios) **E11.65 is extra code for poor control**    A2/B - O24.919  and B-C O24.319  Poor control B-C or D-R-F-T - O24.319  or  Type  I DM - O24.019  20-38  20-38  20-24-28-32-36   20-24-28-32-35-38//fetal echo  20-24-27-30-33-36-38//fetal echo  40  32//2 x wk  32//2 x wk   32//2 x wk  28//BPP wkly then 32//2 x wk  40  39  PRN   39  PRN          Hypothyroid in pregnancy, antepartum  04/13/2017 by Conard Novak, MD No   Overview Addendum 09/11/2017 12:13 PM by Vena Austria, MD    - initial TSH >3.0, started levothyroxine 50 mcg - recheck, TSH at 12/12 appt. - Normal [x]  recheck with 28 week labs normal       Obesity affecting pregnancy, antepartum 03/16/2017 by Oswaldo Conroy, CNM No   BMI 40.0-44.9, adult (HCC) 03/16/2017 by Oswaldo Conroy, CNM No   Overview Addendum 08/10/2017  9:59 AM by Conard Novak, MD    BMI >=40 [x]  early 1h gtt -  [x]  u/s for dating [x]   [x]  nutritional goals [x]  folic acid 1mg  [x]  bASA (>12 weeks) [x]  consider nutrition consult [x]  consider maternal EKG 1st trimester [ ]  Growth u/s 28 [x] , 32 [ ] , 36 weeks [ ]  [ ]  NST/AFI weekly 36+ weeks (36[] , 37[] , 38[] , 39[] , 40[] ) [ ]  IOL by 41 weeks (scheduled, prn [] )       Supervision of high risk pregnancy, antepartum 03/09/2017 by Oswaldo Conroy, CNM No   Overview Addendum 10/09/2017 10:25 AM by Natale Milch, MD    [facility] Prenatal Labs  Dating 6 week [place] Blood type: O Positive O/Positive/-- (10/11 1021)   Genetic Screen Declines all Antibody:Negative (10/11 1021)  Anatomic [place] Normal female  Rubella: Immune 13.80 (10/11 1021) Varicella:  Non-immune  GTT Early: 129    Third trimester: 185  3 hour 101, 192, 166, 156 RPR: Non Reactive (10/11 1021) NR  Rhogam N/A HBsAg: Negative (10/11 1021) Neg  TDaP vaccine 09/11/2017    HIV:   NR  Baby Food Breast                               GBS:   Contraception Progesterone pill Pap: 06/23/2014 NILM [ ]  repeat due postpartum  CBB   Given information   Support Person          Obesity (BMI 30-39.9) 03/09/2017 by Oswaldo Conroy, CNM 03/16/2017 by Oswaldo Conroy, CNM       Preterm labor symptoms and general obstetric precautions including but not limited to vaginal bleeding, contractions, leaking of fluid and fetal movement were reviewed in detail with the patient. Please refer to After Visit Summary for other  counseling recommendations.   - continue to monitor headache. Offered fioricet.  No neurologic symptoms and normotensive. - GDM: continue current regimen. - Keep growth u/s for Thursday and follow up with me with NST on Friday.   Return in about 3 days (around 10/19/2017) for Keep previously scheduled appts.  [name], MD, [facility], [facility] 10/16/2017 1:41 PM

## 2017-10-16 NOTE — Progress Notes (Incomplete)
Routine Prenatal Care Visit  Subjective  Carla [name] is a 27 y.o. [name] at [redacted]w[redacted]d being seen today for ongoing prenatal care.  She is currently monitored for the following issues for this {Blank single:19197::"high-risk","low-risk"} pregnancy and has Supervision of high risk pregnancy, antepartum; Obesity affecting pregnancy, antepartum; BMI 40.0-44.9, adult (HCC); Hypothyroid in pregnancy, antepartum; and Gestational diabetes on their problem list.  ----------------------------------------------------------------------------------- Patient reports {sx:14538}.   Contractions: Not present. Vag. Bleeding: None.  Movement: Present. Denies leaking of fluid.  ----------------------------------------------------------------------------------- The following portions of the patient's history were reviewed and updated as appropriate: allergies, current medications, past family history, past medical history, past social history, past surgical history and problem list. Problem list updated.   Objective  Blood pressure 120/70, weight 248 lb (112.5 kg), last menstrual period 11/12/2016, unknown if currently breastfeeding. Pregravid weight 220 lb (99.8 kg) Total Weight Gain 28 lb (12.7 kg) Urinalysis: Urine Protein: Negative Urine Glucose: 2+  Fetal Status: Fetal Heart Rate (bpm): 160 Fundal Height: 37 cm Movement: Present     General:  Alert, oriented and cooperative. Patient is in no acute distress.  Skin: Skin is warm and dry. No rash noted.   Cardiovascular: Normal heart rate noted  Respiratory: Normal respiratory effort, no problems with respiration noted  Abdomen: Soft, gravid, appropriate for gestational age. Pain/Pressure: Absent     Pelvic:  {Blank single:19197::"Cervical exam performed","Cervical exam deferred"}        Extremities: Normal range of motion.  Edema: None  Mental Status: Normal mood and affect. Normal behavior. Normal judgment and thought content.   Assessment   27 y.o.  [name] at [redacted]w[redacted]d by  11/08/2017, by Ultrasound presenting for {Blank single:19197::"routine","work-in"} prenatal visit  Plan   Pregnancy#2 Problems (from 03/14/17 to present)    Problem Noted Resolved   Gestational diabetes 08/24/2017 by Conard Novak, MD No   Overview Addendum 10/09/2017 10:24 AM by Natale Milch, MD    Current Diabetic Medications:  Glyburide  - started on glyburide 2.5mg  qHS on 09/11/2016  - increase glyburide 5 mg qHS on 09/18/17 - increased to 5 mg BID on 09/29/17 -increased to 5mg  Breakfast, 10mg  at dinner on 10/09/17  [X]  Diabetes Education and Testing Supplies [X]  Nutrition Counsult [ ]  Fetal ECHO after 22-24 weeks  [ ]  US fetal growth every 4 weeks starting at 28 weeks (30 weeks, 37 %ile) [X]  Twice weekly NST starting at [redacted] weeks gestation [ ]  Delivery planning contingent on fetal growth, AFI, glycemic control, and other co-morbidities but at least by 39 weeks  Baseline and surveillance labs (pulled in from [facility], refresh links as needed)  Lab Results  Component Value Date   TSH 1.570 08/10/2017   No results found for: HGBA1C  Antenatal Testing Class of DM U/S NST/AFI DELIVERY  Diabetes   A1 - good control - O24.410    A2 - good control - O24.419      A2  - poor control or poor compliance - O24.419, E11.65   (Macrosomia or polyhydramnios) **E11.65 is extra code for poor control**    A2/B - O24.919  and B-C O24.319  Poor control B-C or D-R-F-T - O24.319  or  Type I DM - O24.019  20-38  20-38  20-24-28-32-36   20-24-28-32-35-38//fetal echo  20-24-27-30-33-36-38//fetal echo  40  32//2 x wk  32//2 x wk   32//2 x wk  28//BPP wkly then 32//2 x wk  40  39  PRN   39  PRN  Hypothyroid in pregnancy, antepartum 04/13/2017 by Conard Novak, MD No   Overview Addendum 09/11/2017 12:13 PM by Vena Austria, MD    - initial TSH >3.0, started levothyroxine 50 mcg - recheck, TSH at 12/12 appt. - Normal [x]  recheck  with 28 week labs normal       Obesity affecting pregnancy, antepartum 03/16/2017 by Oswaldo Conroy, CNM No   BMI 40.0-44.9, adult (HCC) 03/16/2017 by Oswaldo Conroy, CNM No   Overview Addendum 08/10/2017  9:59 AM by Conard Novak, MD    BMI >=40 [x]  early 1h gtt -  [x]  u/s for dating [x]   [x]  nutritional goals [x]  folic acid 1mg  [x]  bASA (>12 weeks) [x]  consider nutrition consult [x]  consider maternal EKG 1st trimester [ ]  Growth u/s 28 [x] , 32 [ ] , 36 weeks [ ]  [ ]  NST/AFI weekly 36+ weeks (36[] , 37[] , 38[] , 39[] , 40[] ) [ ]  IOL by 41 weeks (scheduled, prn [] )       Supervision of high risk pregnancy, antepartum 03/09/2017 by Oswaldo Conroy, CNM No   Overview Addendum 10/09/2017 10:25 AM by Natale Milch, MD    Clinic [facility] Prenatal Labs  Dating 6 week [place] Blood type: O Positive O/Positive/-- (10/11 1021)   Genetic Screen Declines all Antibody:Negative (10/11 1021)  Anatomic [place] Normal female  Rubella: Immune 13.80 (10/11 1021) Varicella:  Non-immune  GTT Early: 129    Third trimester: 185  3 hour 101, 192, 166, 156 RPR: Non Reactive (10/11 1021) NR  Rhogam N/A HBsAg: Negative (10/11 1021) Neg  TDaP vaccine 09/11/2017    HIV:   NR  Baby Food Breast                               GBS:   Contraception Progesterone pill Pap: 06/23/2014 NILM [ ]  repeat due postpartum  CBB   Given information   Support Person          Obesity (BMI 30-39.9) 03/09/2017 by Oswaldo Conroy, CNM 03/16/2017 by Oswaldo Conroy, CNM       {Blank single:19197::"Term","Preterm"} labor symptoms and general obstetric precautions including but not limited to vaginal bleeding, contractions, leaking of fluid and fetal movement were reviewed in detail with the patient. Please refer to After Visit Summary for other counseling recommendations.   Return in about 3 days (around 10/19/2017) for Keep previously scheduled appts.  [name], MD, [facility], [facility] 10/16/2017 1:41 PM

## 2017-10-17 LAB — CHLAMYDIA/GONOCOCCUS/TRICHOMONAS, NAA
Chlamydia by NAA: NEGATIVE
GONOCOCCUS BY NAA: NEGATIVE
Trich vag by NAA: NEGATIVE

## 2017-10-19 ENCOUNTER — Ambulatory Visit (INDEPENDENT_AMBULATORY_CARE_PROVIDER_SITE_OTHER): Payer: BC Managed Care – PPO

## 2017-10-19 DIAGNOSIS — Z3A37 37 weeks gestation of pregnancy: Secondary | ICD-10-CM

## 2017-10-19 DIAGNOSIS — O0990 Supervision of high risk pregnancy, unspecified, unspecified trimester: Secondary | ICD-10-CM

## 2017-10-19 DIAGNOSIS — O0993 Supervision of high risk pregnancy, unspecified, third trimester: Secondary | ICD-10-CM

## 2017-10-20 ENCOUNTER — Encounter: Payer: Self-pay | Admitting: Obstetrics and Gynecology

## 2017-10-20 ENCOUNTER — Ambulatory Visit (INDEPENDENT_AMBULATORY_CARE_PROVIDER_SITE_OTHER): Payer: BC Managed Care – PPO | Admitting: Obstetrics and Gynecology

## 2017-10-20 VITALS — BP 112/72 | Wt 248.0 lb

## 2017-10-20 DIAGNOSIS — O24415 Gestational diabetes mellitus in pregnancy, controlled by oral hypoglycemic drugs: Secondary | ICD-10-CM | POA: Diagnosis not present

## 2017-10-20 DIAGNOSIS — O0990 Supervision of high risk pregnancy, unspecified, unspecified trimester: Secondary | ICD-10-CM

## 2017-10-20 DIAGNOSIS — O99210 Obesity complicating pregnancy, unspecified trimester: Secondary | ICD-10-CM

## 2017-10-20 DIAGNOSIS — E039 Hypothyroidism, unspecified: Secondary | ICD-10-CM

## 2017-10-20 DIAGNOSIS — Z6841 Body Mass Index (BMI) 40.0 and over, adult: Secondary | ICD-10-CM

## 2017-10-20 DIAGNOSIS — O99280 Endocrine, nutritional and metabolic diseases complicating pregnancy, unspecified trimester: Secondary | ICD-10-CM

## 2017-10-20 LAB — FETAL NONSTRESS TEST

## 2017-10-20 NOTE — Progress Notes (Signed)
Routine Prenatal Care Visit  Subjective  Carla [name] is a 27 y.o. [name] at [redacted]w[redacted]d being seen today for ongoing prenatal care.  She is currently monitored for the following issues for this high-risk pregnancy and has Supervision of high risk pregnancy, antepartum; Obesity affecting pregnancy, antepartum; BMI 40.0-44.9, adult (HCC); Hypothyroid in pregnancy, antepartum; and Gestational diabetes on their problem list.  ----------------------------------------------------------------------------------- Patient reports no complaints.   Contractions: Not present. Vag. Bleeding: None.  Movement: Present. Denies leaking of fluid.  GDMA2: most fasting BG ~100 (3/4 last values), all 2 h pp normal. Growth yesterday 29th %ile, AFI 13.6 cm ----------------------------------------------------------------------------------- The following portions of the patient's history were reviewed and updated as appropriate: allergies, current medications, past family history, past medical history, past social history, past surgical history and problem list. Problem list updated.  Objective  Blood pressure 112/72, weight 248 lb (112.5 kg), last menstrual period 11/12/2016, unknown if currently breastfeeding. Pregravid weight 220 lb (99.8 kg) Total Weight Gain 28 lb (12.7 kg) Urinalysis: Urine Protein: Negative Urine Glucose: Trace  Fetal Status: Fetal Heart Rate (bpm): 155   Movement: Present  Presentation: Vertex  General:  Alert, oriented and cooperative. Patient is in no acute distress.  Skin: Skin is warm and dry. No rash noted.   Cardiovascular: Normal heart rate noted  Respiratory: Normal respiratory effort, no problems with respiration noted  Abdomen: Soft, gravid, appropriate for gestational age. Pain/Pressure: Absent     Pelvic:  Cervical exam deferred        Extremities: Normal range of motion.  Edema: None  Mental Status: Normal mood and affect. Normal behavior. Normal judgment and thought content.    NST: Baseline FHR: 155 beats/min Variability: moderate Accelerations: present Decelerations: absent Tocometry: not done  Interpretation:  INDICATIONS: gestational diabetes mellitus RESULTS:  A NST procedure was performed with FHR monitoring and a normal baseline established, appropriate time of 20-40 minutes of evaluation, and accels >2 seen w 15x15 characteristics.  Results show a REACTIVE NST.    US Ob Follow Up  Result Date: 10/20/2017 ULTRASOUND REPORT Patient Name: Carla [name] DOB: 01-17-1991 MRN: [id number] Location: [facility] Date of Service: 10/19/2017 Indications:growth/afi for gestational diabetes Findings: Mason Jim intrauterine pregnancy is visualized with FHR at 152 BPM. Biometrics give an (U/S) Gestational age of [redacted]w[redacted]d and an (U/S) EDD of 11/20/2017; this correlates with the clinically established Estimated Date of Delivery: 11/08/17. Fetal presentation is Cephalic. Placenta: Anterior, grade 3. AFI: 13.60 cm Growth percentile is 29th. EFW: 6lbs 4oz Impression: 1. [redacted]w[redacted]d Viable Singleton Intrauterine pregnancy previously established criteria. 2. Growth is 29th %ile.  AFI is 13.60 cm. Recommendations: 1.Clinical correlation with the patient's History and Physical Exam. [name], RDMS, RVT The ultrasound images and findings were reviewed by me and I agree with the above report. [name], MD, [facility], [facility] 10/20/2017 10:06 AM     Assessment   26 y.o. [name] at [redacted]w[redacted]d by  11/08/2017, by Ultrasound presenting for routine prenatal visit  Plan   Pregnancy#2 Problems (from 03/14/17 to present)    Problem Noted Resolved   Gestational diabetes 08/24/2017 by Conard Novak, MD No   Overview Addendum 10/09/2017 10:24 AM by Natale Milch, MD    Current Diabetic Medications:  Glyburide  - started on glyburide 2.5mg  qHS on 09/11/2016  - increase glyburide 5 mg qHS on 09/18/17 - increased to 5 mg BID on 09/29/17 -increased to 5mg   Breakfast, 10mg  at dinner on 10/09/17  [X]  Diabetes  Education and Child psychotherapist [X]  Nutrition Counsult [ ]  Fetal ECHO after 22-24 weeks  [ ]  US fetal growth every 4 weeks starting at 28 weeks (30 weeks, 37 %ile) [X]  Twice weekly NST starting at [redacted] weeks gestation [ ]  Delivery planning contingent on fetal growth, AFI, glycemic control, and other co-morbidities but at least by 39 weeks  Baseline and surveillance labs (pulled in from [facility], refresh links as needed)  Lab Results  Component Value Date   TSH 1.570 08/10/2017   No results found for: HGBA1C  Antenatal Testing Class of DM U/S NST/AFI DELIVERY  Diabetes   A1 - good control - O24.410    A2 - good control - O24.419      A2  - poor control or poor compliance - O24.419, E11.65   (Macrosomia or polyhydramnios) **E11.65 is extra code for poor control**    A2/B - O24.919  and B-C O24.319  Poor control B-C or D-R-F-T - O24.319  or  Type I DM - O24.019  20-38  20-38  20-24-28-32-36   20-24-28-32-35-38//fetal echo  20-24-27-30-33-36-38//fetal echo  40  32//2 x wk  32//2 x wk   32//2 x wk  28//BPP wkly then 32//2 x wk  40  39  PRN   39  PRN          Hypothyroid in pregnancy, antepartum 04/13/2017 by Conard Novak, MD No   Overview Addendum 09/11/2017 12:13 PM by Vena Austria, MD    - initial TSH >3.0, started levothyroxine 50 mcg - recheck, TSH at 12/12 appt. - Normal [x]  recheck with 28 week labs normal       Obesity affecting pregnancy, antepartum 03/16/2017 by Oswaldo Conroy, CNM No   BMI 40.0-44.9, adult (HCC) 03/16/2017 by Oswaldo Conroy, CNM No   Overview Addendum 08/10/2017  9:59 AM by Conard Novak, MD    BMI >=40 [x]  early 1h gtt -  [x]  u/s for dating [x]   [x]  nutritional goals [x]  folic acid 1mg  [x]  bASA (>12 weeks) [x]  consider nutrition consult [x]  consider maternal EKG 1st trimester [ ]  Growth u/s 28 [x] , 32 [ ] , 36 weeks [ ]  [ ]  NST/AFI weekly 36+ weeks  (36[] , 37[] , 38[] , 39[] , 40[] ) [ ]  IOL by 41 weeks (scheduled, prn [] )       Supervision of high risk pregnancy, antepartum 03/09/2017 by Oswaldo Conroy, CNM No   Overview Addendum 10/09/2017 10:25 AM by Natale Milch, MD    Clinic [facility] Prenatal Labs  Dating 6 week [place] Blood type: O Positive O/Positive/-- (10/11 1021)   Genetic Screen Declines all Antibody:Negative (10/11 1021)  Anatomic [place] Normal female  Rubella: Immune 13.80 (10/11 1021) Varicella:  Non-immune  GTT Early: 129    Third trimester: 185  3 hour 101, 192, 166, 156 RPR: Non Reactive (10/11 1021) NR  Rhogam N/A HBsAg: Negative (10/11 1021) Neg  TDaP vaccine 09/11/2017    HIV:   NR  Baby Food Breast                               GBS:   Contraception Progesterone pill Pap: 06/23/2014 NILM [ ]  repeat due postpartum  CBB   Given information   Support Person          Obesity (BMI 30-39.9) 03/09/2017 by Oswaldo Conroy, CNM 03/16/2017 by Oswaldo Conroy, CNM       Term labor symptoms and general  obstetric precautions including but not limited to vaginal bleeding, contractions, leaking of fluid and fetal movement were reviewed in detail with the patient. Please refer to After Visit Summary for other counseling recommendations.   - Encouraged improvement of fasting BG and what she eats for dinner and at bed time - schedule IOL for 39 weeks on 5/29 @ 0800  Return in about 3 days (around 10/23/2017) for Routine prenatal and NST, schedule U/s for AFI with routine prenatal/NST in one week.  [name], MD, [facility], [facility] 10/20/2017 10:16 AM

## 2017-10-20 NOTE — Progress Notes (Incomplete)
Routine Prenatal Care Visit  Subjective  Carla [name] is a 27 y.o. [name] at [redacted]w[redacted]d being seen today for ongoing prenatal care.  She is currently monitored for the following issues for this {Blank single:19197::"high-risk","low-risk"} pregnancy and has Supervision of high risk pregnancy, antepartum; Obesity affecting pregnancy, antepartum; BMI 40.0-44.9, adult (HCC); Hypothyroid in pregnancy, antepartum; and Gestational diabetes on their problem list.  ----------------------------------------------------------------------------------- Patient reports {sx:14538}.   Contractions: Not present. Vag. Bleeding: None.  Movement: Present. Denies leaking of fluid.  ----------------------------------------------------------------------------------- The following portions of the patient's history were reviewed and updated as appropriate: allergies, current medications, past family history, past medical history, past social history, past surgical history and problem list. Problem list updated.   Objective  Blood pressure 112/72, weight 248 lb (112.5 kg), last menstrual period 11/12/2016, unknown if currently breastfeeding. Pregravid weight 220 lb (99.8 kg) Total Weight Gain 28 lb (12.7 kg) Urinalysis: Urine Protein: Negative Urine Glucose: Trace  Fetal Status: Fetal Heart Rate (bpm): 155   Movement: Present  Presentation: Vertex  General:  Alert, oriented and cooperative. Patient is in no acute distress.  Skin: Skin is warm and dry. No rash noted.   Cardiovascular: Normal heart rate noted  Respiratory: Normal respiratory effort, no problems with respiration noted  Abdomen: Soft, gravid, appropriate for gestational age. Pain/Pressure: Absent     Pelvic:  {Blank single:19197::"Cervical exam performed","Cervical exam deferred"}        Extremities: Normal range of motion.  Edema: None  Mental Status: Normal mood and affect. Normal behavior. Normal judgment and thought content.   Assessment   27  y.o. [name] at [redacted]w[redacted]d by  11/08/2017, by Ultrasound presenting for {Blank single:19197::"routine","work-in"} prenatal visit  Plan   Pregnancy#2 Problems (from 03/14/17 to present)    Problem Noted Resolved   Gestational diabetes 08/24/2017 by Conard Novak, MD No   Overview Addendum 10/09/2017 10:24 AM by Natale Milch, MD    Current Diabetic Medications:  Glyburide  - started on glyburide 2.5mg  qHS on 09/11/2016  - increase glyburide 5 mg qHS on 09/18/17 - increased to 5 mg BID on 09/29/17 -increased to 5mg  Breakfast, 10mg  at dinner on 10/09/17  [X]  Diabetes Education and Testing Supplies [X]  Nutrition Counsult [ ]  Fetal ECHO after 22-24 weeks  [ ]  US fetal growth every 4 weeks starting at 28 weeks (30 weeks, 37 %ile) [X]  Twice weekly NST starting at [redacted] weeks gestation [ ]  Delivery planning contingent on fetal growth, AFI, glycemic control, and other co-morbidities but at least by 39 weeks  Baseline and surveillance labs (pulled in from [facility], refresh links as needed)  Lab Results  Component Value Date   TSH 1.570 08/10/2017   No results found for: HGBA1C  Antenatal Testing Class of DM U/S NST/AFI DELIVERY  Diabetes   A1 - good control - O24.410    A2 - good control - O24.419      A2  - poor control or poor compliance - O24.419, E11.65   (Macrosomia or polyhydramnios) **E11.65 is extra code for poor control**    A2/B - O24.919  and B-C O24.319  Poor control B-C or D-R-F-T - O24.319  or  Type I DM - O24.019  20-38  20-38  20-24-28-32-36   20-24-28-32-35-38//fetal echo  20-24-27-30-33-36-38//fetal echo  40  32//2 x wk  32//2 x wk   32//2 x wk  28//BPP wkly then 32//2 x wk  40  39  PRN   39  PRN  Hypothyroid in pregnancy, antepartum 04/13/2017 by Conard Novak, MD No   Overview Addendum 09/11/2017 12:13 PM by Vena Austria, MD    - initial TSH >3.0, started levothyroxine 50 mcg - recheck, TSH at 12/12 appt. - Normal [x]   recheck with 28 week labs normal       Obesity affecting pregnancy, antepartum 03/16/2017 by Oswaldo Conroy, CNM No   BMI 40.0-44.9, adult (HCC) 03/16/2017 by Oswaldo Conroy, CNM No   Overview Addendum 08/10/2017  9:59 AM by Conard Novak, MD    BMI >=40 [x]  early 1h gtt -  [x]  u/s for dating [x]   [x]  nutritional goals [x]  folic acid 1mg  [x]  bASA (>12 weeks) [x]  consider nutrition consult [x]  consider maternal EKG 1st trimester [ ]  Growth u/s 28 [x] , 32 [ ] , 36 weeks [ ]  [ ]  NST/AFI weekly 36+ weeks (36[] , 37[] , 38[] , 39[] , 40[] ) [ ]  IOL by 41 weeks (scheduled, prn [] )       Supervision of high risk pregnancy, antepartum 03/09/2017 by Oswaldo Conroy, CNM No   Overview Addendum 10/09/2017 10:25 AM by Natale Milch, MD    Clinic [facility] Prenatal Labs  Dating 6 week [place] Blood type: O Positive O/Positive/-- (10/11 1021)   Genetic Screen Declines all Antibody:Negative (10/11 1021)  Anatomic [place] Normal female  Rubella: Immune 13.80 (10/11 1021) Varicella:  Non-immune  GTT Early: 129    Third trimester: 185  3 hour 101, 192, 166, 156 RPR: Non Reactive (10/11 1021) NR  Rhogam N/A HBsAg: Negative (10/11 1021) Neg  TDaP vaccine 09/11/2017    HIV:   NR  Baby Food Breast                               GBS:   Contraception Progesterone pill Pap: 06/23/2014 NILM [ ]  repeat due postpartum  CBB   Given information   Support Person          Obesity (BMI 30-39.9) 03/09/2017 by Oswaldo Conroy, CNM 03/16/2017 by Oswaldo Conroy, CNM       {Blank single:19197::"Term","Preterm"} labor symptoms and general obstetric precautions including but not limited to vaginal bleeding, contractions, leaking of fluid and fetal movement were reviewed in detail with the patient. Please refer to After Visit Summary for other counseling recommendations.   Return in about 3 days (around 10/23/2017) for Routine prenatal and NST, schedule U/s for AFI with routine prenatal/NST in one  week.  [name], MD, [facility], [facility] 10/20/2017 10:16 AM

## 2017-10-24 ENCOUNTER — Ambulatory Visit (INDEPENDENT_AMBULATORY_CARE_PROVIDER_SITE_OTHER): Payer: BC Managed Care – PPO | Admitting: Obstetrics and Gynecology

## 2017-10-24 ENCOUNTER — Encounter: Payer: Self-pay | Admitting: Obstetrics and Gynecology

## 2017-10-24 VITALS — BP 120/68 | Wt 247.0 lb

## 2017-10-24 DIAGNOSIS — O24415 Gestational diabetes mellitus in pregnancy, controlled by oral hypoglycemic drugs: Secondary | ICD-10-CM | POA: Diagnosis not present

## 2017-10-24 DIAGNOSIS — O99210 Obesity complicating pregnancy, unspecified trimester: Secondary | ICD-10-CM

## 2017-10-24 DIAGNOSIS — O99280 Endocrine, nutritional and metabolic diseases complicating pregnancy, unspecified trimester: Secondary | ICD-10-CM

## 2017-10-24 DIAGNOSIS — O0990 Supervision of high risk pregnancy, unspecified, unspecified trimester: Secondary | ICD-10-CM

## 2017-10-24 DIAGNOSIS — Z6841 Body Mass Index (BMI) 40.0 and over, adult: Secondary | ICD-10-CM

## 2017-10-24 DIAGNOSIS — Z3A37 37 weeks gestation of pregnancy: Secondary | ICD-10-CM

## 2017-10-24 DIAGNOSIS — E039 Hypothyroidism, unspecified: Secondary | ICD-10-CM

## 2017-10-24 LAB — FETAL NONSTRESS TEST

## 2017-10-24 NOTE — Progress Notes (Incomplete)
Routine Prenatal Care Visit  Subjective  Carla [name] is a 27 y.o. [name] at [redacted]w[redacted]d being seen today for ongoing prenatal care.  She is currently monitored for the following issues for this {Blank single:19197::"high-risk","low-risk"} pregnancy and has Supervision of high risk pregnancy, antepartum; Obesity affecting pregnancy, antepartum; BMI 40.0-44.9, adult (HCC); Hypothyroid in pregnancy, antepartum; and Gestational diabetes on their problem list.  ----------------------------------------------------------------------------------- Patient reports {sx:[id number]}.   Contractions: Not present. Vag. Bleeding: None.  Movement: Present. Denies leaking of fluid.  ----------------------------------------------------------------------------------- The following portions of the patient's history were reviewed and updated as appropriate: allergies, current medications, past family history, past medical history, past social history, past surgical history and problem list. Problem list updated.   Objective  Blood pressure 120/68, weight 247 lb (112 kg), last menstrual period 11/12/2016, unknown if currently breastfeeding. Pregravid weight 220 lb (99.8 kg) Total Weight Gain 27 lb (12.2 kg) Urinalysis: Urine Protein: Negative Urine Glucose: Negative  Fetal Status: Fetal Heart Rate (bpm): 155   Movement: Present  Presentation: Vertex  General:  Alert, oriented and cooperative. Patient is in no acute distress.  Skin: Skin is warm and dry. No rash noted.   Cardiovascular: Normal heart rate noted  Respiratory: Normal respiratory effort, no problems with respiration noted  Abdomen: Soft, gravid, appropriate for gestational age. Pain/Pressure: Absent     Pelvic:  {Blank single:19197::"Cervical exam performed","Cervical exam deferred"}        Extremities: Normal range of motion.  Edema: None  Mental Status: Normal mood and affect. Normal behavior. Normal judgment and thought content.   Assessment   27  y.o. [name] at [redacted]w[redacted]d by  11/08/2017, by Ultrasound presenting for {Blank single:19197::"routine","work-in"} prenatal visit  Plan   Pregnancy#2 Problems (from 03/14/17 to present)    Problem Noted Resolved   Gestational diabetes 08/24/2017 by Conard Novak, MD No   Overview Addendum 10/09/2017 10:24 AM by Natale Milch, MD    Current Diabetic Medications:  Glyburide  - started on glyburide 2.5mg  qHS on 09/11/2016  - increase glyburide 5 mg qHS on 09/18/17 - increased to 5 mg BID on 09/29/17 -increased to 5mg  Breakfast, 10mg  at dinner on 10/09/17  [X]  Diabetes Education and Testing Supplies [X]  Nutrition Counsult [ ]  Fetal ECHO after 22-24 weeks  [ ]  US fetal growth every 4 weeks starting at 28 weeks (30 weeks, 37 %ile) [X]  Twice weekly NST starting at [redacted] weeks gestation [ ]  Delivery planning contingent on fetal growth, AFI, glycemic control, and other co-morbidities but at least by 39 weeks  Baseline and surveillance labs (pulled in from [facility], refresh links as needed)  Lab Results  Component Value Date   TSH 1.570 08/10/2017   No results found for: HGBA1C  Antenatal Testing Class of DM U/S NST/AFI DELIVERY  Diabetes   A1 - good control - O24.410    A2 - good control - O24.419      A2  - poor control or poor compliance - O24.419, E11.65   (Macrosomia or polyhydramnios) **E11.65 is extra code for poor control**    A2/B - O24.919  and B-C O24.319  Poor control B-C or D-R-F-T - O24.319  or  Type I DM - O24.019  20-38  20-38  20-24-28-32-36   20-24-28-32-35-38//fetal echo  20-24-27-30-33-36-38//fetal echo  40  32//2 x wk  32//2 x wk   32//2 x wk  28//BPP wkly then 32//2 x wk  40  39  PRN   39  PRN  Hypothyroid in pregnancy, antepartum 04/13/2017 by Conard Novak, MD No   Overview Addendum 09/11/2017 12:13 PM by Vena Austria, MD    - initial TSH >3.0, started levothyroxine 50 mcg - recheck, TSH at 12/12 appt. - Normal [x]   recheck with 28 week labs normal       Obesity affecting pregnancy, antepartum 03/16/2017 by Oswaldo Conroy, CNM No   BMI 40.0-44.9, adult (HCC) 03/16/2017 by Oswaldo Conroy, CNM No   Overview Addendum 08/10/2017  9:59 AM by Conard Novak, MD    BMI >=40 [x]  early 1h gtt -  [x]  u/s for dating [x]   [x]  nutritional goals [x]  folic acid 1mg  [x]  bASA (>12 weeks) [x]  consider nutrition consult [x]  consider maternal EKG 1st trimester [ ]  Growth u/s 28 [x] , 32 [ ] , 36 weeks [ ]  [ ]  NST/AFI weekly 36+ weeks (36[] , 37[] , 38[] , 39[] , 40[] ) [ ]  IOL by 41 weeks (scheduled, prn [] )       Supervision of high risk pregnancy, antepartum 03/09/2017 by Oswaldo Conroy, CNM No   Overview Addendum 10/09/2017 10:25 AM by Natale Milch, MD    Clinic [facility] Prenatal Labs  Dating 6 week [place] Blood type: O Positive O/Positive/-- (10/11 1021)   Genetic Screen Declines all Antibody:Negative (10/11 1021)  Anatomic [place] Normal female  Rubella: Immune 13.80 (10/11 1021) Varicella:  Non-immune  GTT Early: 129    Third trimester: 185  3 hour 101, 192, 166, 156 RPR: Non Reactive (10/11 1021) NR  Rhogam N/A HBsAg: Negative (10/11 1021) Neg  TDaP vaccine 09/11/2017    HIV:   NR  Baby Food Breast                               GBS:   Contraception Progesterone pill Pap: 06/23/2014 NILM [ ]  repeat due postpartum  CBB   Given information   Support Person          Obesity (BMI 30-39.9) 03/09/2017 by Oswaldo Conroy, CNM 03/16/2017 by Oswaldo Conroy, CNM       {Blank single:19197::"Term","Preterm"} labor symptoms and general obstetric precautions including but not limited to vaginal bleeding, contractions, leaking of fluid and fetal movement were reviewed in detail with the patient. Please refer to After Visit Summary for other counseling recommendations.   Return for Keep appt at L&D at 0900 on 10/30/17.  [name], MD, [facility], [facility] 10/24/2017 3:32 PM

## 2017-10-24 NOTE — Progress Notes (Signed)
Routine Prenatal Care Visit  Subjective  Carla [name] is a 27 y.o. [name] at [redacted]w[redacted]d being seen today for ongoing prenatal care.  She is currently monitored for the following issues for this high-risk pregnancy and has Supervision of high risk pregnancy, antepartum; Obesity affecting pregnancy, antepartum; BMI 40.0-44.9, adult (HCC); Hypothyroid in pregnancy, antepartum; and Gestational diabetes on their problem list.  ----------------------------------------------------------------------------------- Patient reports no complaints.   Contractions: Not present. Vag. Bleeding: None.  Movement: Present. Denies leaking of fluid.  GDMA2: 50% of fasting (2/4) since last visit slightly elevated (highest is 100), all 2 h pp normal          ----------------------------------------------------------------------------------- The following portions of the patient's history were reviewed and updated as appropriate: allergies, current medications, past family history, past medical history, past social history, past surgical history and problem list. Problem list updated.  Objective  Blood pressure 120/68, weight 247 lb (112 kg), last menstrual period 11/12/2016, unknown if currently breastfeeding. Pregravid weight 220 lb (99.8 kg) Total Weight Gain 27 lb (12.2 kg) Urinalysis: Urine Protein: Negative Urine Glucose: Negative  Fetal Status: Fetal Heart Rate (bpm): 155   Movement: Present  Presentation: Vertex  General:  Alert, oriented and cooperative. Patient is in no acute distress.  Skin: Skin is warm and dry. No rash noted.   Cardiovascular: Normal heart rate noted  Respiratory: Normal respiratory effort, no problems with respiration noted  Abdomen: Soft, gravid, appropriate for gestational age. Pain/Pressure: Absent     Pelvic:  Cervical exam deferred        Extremities: Normal range of motion.  Edema: None  Mental Status: Normal mood and affect. Normal behavior. Normal judgment and thought  content.   NST: Baseline FHR: 155 beats/min Variability: moderate Accelerations: present Decelerations: absent Tocometry: not done  Interpretation:  INDICATIONS: gestational diabetes mellitus RESULTS:  A NST procedure was performed with FHR monitoring and a normal baseline established, appropriate time of 20-40 minutes of evaluation, and accels >2 seen w 15x15 characteristics.  Results show a REACTIVE NST.    Assessment   27 y.o. [name] at [redacted]w[redacted]d by  11/08/2017, by Ultrasound presenting for routine prenatal visit  Plan   Pregnancy#2 Problems (from 03/14/17 to present)    Problem Noted Resolved   Gestational diabetes 08/24/2017 by Conard Novak, MD No   Overview Addendum 10/09/2017 10:24 AM by Natale Milch, MD    Current Diabetic Medications:  Glyburide  - started on glyburide 2.5mg  qHS on 09/11/2016  - increase glyburide 5 mg qHS on 09/18/17 - increased to 5 mg BID on 09/29/17 -increased to 5mg  Breakfast, 10mg  at dinner on 10/09/17  [X]  Diabetes Education and Testing Supplies [X]  Nutrition Counsult [ ]  Fetal ECHO after 22-24 weeks  [ ]  US fetal growth every 4 weeks starting at 28 weeks (30 weeks, 37 %ile) [X]  Twice weekly NST starting at [redacted] weeks gestation [ ]  Delivery planning contingent on fetal growth, AFI, glycemic control, and other co-morbidities but at least by 39 weeks  Baseline and surveillance labs (pulled in from [facility], refresh links as needed)  Lab Results  Component Value Date   TSH 1.570 08/10/2017   No results found for: HGBA1C  Antenatal Testing Class of DM U/S NST/AFI DELIVERY  Diabetes   A1 - good control - O24.410    A2 - good control - O24.419      A2  - poor control or poor compliance - O24.419, E11.65   (Macrosomia or polyhydramnios) **E11.65 is extra code  for poor control**    A2/B - O24.919  and B-C O24.319  Poor control B-C or D-R-F-T - O24.319  or  Type I DM - O24.019  20-38  20-38  20-24-28-32-36   20-24-28-32-35-38//fetal  echo  20-24-27-30-33-36-38//fetal echo  40  32//2 x wk  32//2 x wk   32//2 x wk  28//BPP wkly then 32//2 x wk  40  39  PRN   39  PRN        Hypothyroid in pregnancy, antepartum 04/13/2017 by Conard Novak, MD No   Overview Addendum 09/11/2017 12:13 PM by Vena Austria, MD    - initial TSH >3.0, started levothyroxine 50 mcg - recheck, TSH at 12/12 appt. - Normal [x]  recheck with 28 week labs normal      Obesity affecting pregnancy, antepartum 03/16/2017 by Oswaldo Conroy, CNM No   BMI 40.0-44.9, adult (HCC) 03/16/2017 by Oswaldo Conroy, CNM No   Overview Addendum 08/10/2017  9:59 AM by Conard Novak, MD    BMI >=40 [x]  early 1h gtt -  [x]  u/s for dating [x]   [x]  nutritional goals [x]  folic acid 1mg  [x]  bASA (>12 weeks) [x]  consider nutrition consult [x]  consider maternal EKG 1st trimester [ ]  Growth u/s 28 [x] , 32 [ ] , 36 weeks [ ]  [ ]  NST/AFI weekly 36+ weeks (36[] , 37[] , 38[] , 39[] , 40[] ) [ ]  IOL by 41 weeks (scheduled, prn [] )      Supervision of high risk pregnancy, antepartum 03/09/2017 by Oswaldo Conroy, CNM No   Overview Addendum 10/09/2017 10:25 AM by Natale Milch, MD    Clinic [facility] Prenatal Labs  Dating 6 week [place] Blood type: O Positive O/Positive/-- (10/11 1021)   Genetic Screen Declines all Antibody:Negative (10/11 1021)  Anatomic [place] Normal female  Rubella: Immune 13.80 (10/11 1021) Varicella:  Non-immune  GTT Early: 129    Third trimester: 185  3 hour 101, 192, 166, 156 RPR: Non Reactive (10/11 1021) NR  Rhogam N/A HBsAg: Negative (10/11 1021) Neg  TDaP vaccine 09/11/2017    HIV:   NR  Baby Food Breast                               GBS:   Contraception Progesterone pill Pap: 06/23/2014 NILM [ ]  repeat due postpartum  CBB   Given information   Support Person          Obesity (BMI 30-39.9) 03/09/2017 by Oswaldo Conroy, CNM 03/16/2017 by Oswaldo Conroy, CNM      Term labor symptoms and general obstetric  precautions including but not limited to vaginal bleeding, contractions, leaking of fluid and fetal movement were reviewed in detail with the patient. Please refer to After Visit Summary for other counseling recommendations.   Return for Keep appt at L&D at 0900 on 10/30/17.  [name], MD, [facility], [facility] 10/24/2017 3:32 PM

## 2017-10-24 NOTE — Patient Instructions (Signed)
Go to L&D (check in at ER registration desk) at 0900 on 5/27 for a non-stress test.

## 2017-10-27 ENCOUNTER — Ambulatory Visit (INDEPENDENT_AMBULATORY_CARE_PROVIDER_SITE_OTHER): Payer: BC Managed Care – PPO | Admitting: Obstetrics and Gynecology

## 2017-10-27 ENCOUNTER — Ambulatory Visit (INDEPENDENT_AMBULATORY_CARE_PROVIDER_SITE_OTHER): Payer: BC Managed Care – PPO

## 2017-10-27 ENCOUNTER — Encounter: Payer: Self-pay | Admitting: Obstetrics and Gynecology

## 2017-10-27 VITALS — BP 128/72 | Wt 254.0 lb

## 2017-10-27 DIAGNOSIS — Z3A38 38 weeks gestation of pregnancy: Secondary | ICD-10-CM | POA: Diagnosis not present

## 2017-10-27 DIAGNOSIS — O99210 Obesity complicating pregnancy, unspecified trimester: Secondary | ICD-10-CM

## 2017-10-27 DIAGNOSIS — O24415 Gestational diabetes mellitus in pregnancy, controlled by oral hypoglycemic drugs: Secondary | ICD-10-CM

## 2017-10-27 DIAGNOSIS — O99213 Obesity complicating pregnancy, third trimester: Secondary | ICD-10-CM

## 2017-10-27 DIAGNOSIS — O0990 Supervision of high risk pregnancy, unspecified, unspecified trimester: Secondary | ICD-10-CM | POA: Diagnosis not present

## 2017-10-27 DIAGNOSIS — O0993 Supervision of high risk pregnancy, unspecified, third trimester: Secondary | ICD-10-CM

## 2017-10-27 DIAGNOSIS — Z6841 Body Mass Index (BMI) 40.0 and over, adult: Secondary | ICD-10-CM

## 2017-10-27 DIAGNOSIS — E039 Hypothyroidism, unspecified: Secondary | ICD-10-CM

## 2017-10-27 DIAGNOSIS — O99280 Endocrine, nutritional and metabolic diseases complicating pregnancy, unspecified trimester: Secondary | ICD-10-CM

## 2017-10-27 LAB — FETAL NONSTRESS TEST

## 2017-10-27 MED ORDER — METFORMIN HCL 500 MG PO TABS: 500.0000 mg | ORAL_TABLET | Freq: Every day | ORAL | 0 refills | Status: DC

## 2017-10-27 NOTE — Progress Notes (Signed)
Routine Prenatal Care Visit  Subjective  Carla [name] is a 27 y.o. [name] at [redacted]w[redacted]d being seen today for ongoing prenatal care.  She is currently monitored for the following issues for this high-risk pregnancy and has Supervision of high risk pregnancy, antepartum; Obesity affecting pregnancy, antepartum; BMI 40.0-44.9, adult (HCC); Hypothyroid in pregnancy, antepartum; and Gestational diabetes on their problem list.  ----------------------------------------------------------------------------------- Patient reports no complaints.   Contractions: Not present. Vag. Bleeding: None.  Movement: Present. Denies leaking of fluid.  GDMA2: still does not have fasting BG values under control. See below.     ----------------------------------------------------------------------------------- The following portions of the patient's history were reviewed and updated as appropriate: allergies, current medications, past family history, past medical history, past social history, past surgical history and problem list. Problem list updated.   Objective  Blood pressure 128/72, weight 254 lb (115.2 kg), last menstrual period 11/12/2016, unknown if currently breastfeeding. Pregravid weight 220 lb (99.8 kg) Total Weight Gain 34 lb (15.4 kg) Urinalysis:      Fetal Status: Fetal Heart Rate (bpm): 145   Movement: Present  Presentation: Vertex  General:  Alert, oriented and cooperative. Patient is in no acute distress.  Skin: Skin is warm and dry. No rash noted.   Cardiovascular: Normal heart rate noted  Respiratory: Normal respiratory effort, no problems with respiration noted  Abdomen: Soft, gravid, appropriate for gestational age. Pain/Pressure: Absent     Pelvic:  Cervical exam deferred        Extremities: Normal range of motion.  Edema: None  Mental Status: Normal mood and affect. Normal behavior. Normal judgment and thought content.   NST Baseline FHR: 145 beats/min Variability:  moderate Accelerations: present Decelerations: absent Tocometry: not done  Interpretation:  INDICATIONS: gestational diabetes mellitus RESULTS:  A NST procedure was performed with FHR monitoring and a normal baseline established, appropriate time of 20-40 minutes of evaluation, and accels >2 seen w 15x15 characteristics.  Results show a REACTIVE NST.    Assessment   27 y.o. [name] at [redacted]w[redacted]d by  11/08/2017, by Ultrasound presenting for routine prenatal visit  Plan   Pregnancy#2 Problems (from 03/14/17 to present)    Problem Noted Resolved   Gestational diabetes 08/24/2017 by Conard Novak, MD No   Overview Addendum 10/27/2017  2:07 PM by Conard Novak, MD    Current Diabetic Medications:  Glyburide  - started on glyburide 2.5mg  qHS on 09/11/2016  - increase glyburide 5 mg qHS on 09/18/17 - increased to 5 mg BID on 09/29/17 -increased to 5mg  Breakfast, 10mg  at dinner on 10/09/17 - added metformin 500 mg HS on 5/24  [X]  Diabetes Education and Testing Supplies [X]  Nutrition Counsult [ ]  Fetal ECHO after 22-24 weeks  [ ]  US fetal growth every 4 weeks starting at 28 weeks (30 weeks, 37 %ile) [X]  Twice weekly NST starting at [redacted] weeks gestation [ ]  Delivery planning contingent on fetal growth, AFI, glycemic control, and other co-morbidities but at least by 39 weeks  Baseline and surveillance labs (pulled in from [facility], refresh links as needed)  Lab Results  Component Value Date   TSH 1.570 08/10/2017   No results found for: HGBA1C  Antenatal Testing Class of DM U/S NST/AFI DELIVERY  Diabetes   A1 - good control - O24.410    A2 - good control - O24.419      A2  - poor control or poor compliance - O24.419, E11.65   (Macrosomia or polyhydramnios) **E11.65 is extra code for poor control**  A2/B - O24.919  and B-C O24.319  Poor control B-C or D-R-F-T - O24.319  or  Type I DM - O24.019  20-38  20-38  20-24-28-32-36   20-24-28-32-35-38//fetal  echo  20-24-27-30-33-36-38//fetal echo  40  32//2 x wk  32//2 x wk   32//2 x wk  28//BPP wkly then 32//2 x wk  40  39  PRN   39  PRN          Hypothyroid in pregnancy, antepartum 04/13/2017 by Conard Novak, MD No   Overview Addendum 09/11/2017 12:13 PM by Vena Austria, MD    - initial TSH >3.0, started levothyroxine 50 mcg - recheck, TSH at 12/12 appt. - Normal [x]  recheck with 28 week labs normal       Obesity affecting pregnancy, antepartum 03/16/2017 by Oswaldo Conroy, CNM No   BMI 40.0-44.9, adult (HCC) 03/16/2017 by Oswaldo Conroy, CNM No   Overview Addendum 08/10/2017  9:59 AM by Conard Novak, MD    BMI >=40 [x]  early 1h gtt -  [x]  u/s for dating [x]   [x]  nutritional goals [x]  folic acid 1mg  [x]  bASA (>12 weeks) [x]  consider nutrition consult [x]  consider maternal EKG 1st trimester [ ]  Growth u/s 28 [x] , 32 [ ] , 36 weeks [ ]  [ ]  NST/AFI weekly 36+ weeks (36[] , 37[] , 38[] , 39[] , 40[] ) [ ]  IOL by 41 weeks (scheduled, prn [] )       Supervision of high risk pregnancy, antepartum 03/09/2017 by Oswaldo Conroy, CNM No   Overview Addendum 10/09/2017 10:25 AM by Natale Milch, MD    [facility] Prenatal Labs  Dating 6 week [place] Blood type: O Positive O/Positive/-- (10/11 1021)   Genetic Screen Declines all Antibody:Negative (10/11 1021)  Anatomic [place] Normal female  Rubella: Immune 13.80 (10/11 1021) Varicella:  Non-immune  GTT Early: 129    Third trimester: 185  3 hour 101, 192, 166, 156 RPR: Non Reactive (10/11 1021) NR  Rhogam N/A HBsAg: Negative (10/11 1021) Neg  TDaP vaccine 09/11/2017    HIV:   NR  Baby Food Breast                               GBS:   Contraception Progesterone pill Pap: 06/23/2014 NILM [ ]  repeat due postpartum  CBB   Given information   Support Person          Obesity (BMI 30-39.9) 03/09/2017 by Oswaldo Conroy, CNM 03/16/2017 by Oswaldo Conroy, CNM       Term labor symptoms and general  obstetric precautions including but not limited to vaginal bleeding, contractions, leaking of fluid and fetal movement were reviewed in detail with the patient. Please refer to After Visit Summary for other counseling recommendations.   - added metformin 500 mg HS.   - NST in 3 days - stressed importance of tight BG control - iol scheduled for 5/29  Return if symptoms worsen or fail to improve, for Has NST schedule for 3 days at [facility], IOL scheduled for 5/29.  [name], MD, [facility], [facility] 10/27/2017 3:20 PM

## 2017-10-30 ENCOUNTER — Inpatient Hospital Stay
Admission: EM | Admit: 2017-10-30 | Discharge: 2017-10-31 | DRG: 807 | Disposition: A | Discharge status: Home / Self Care | Payer: BC Managed Care – PPO | Attending: Obstetrics and Gynecology | Admitting: Obstetrics and Gynecology

## 2017-10-30 ENCOUNTER — Other Ambulatory Visit: Payer: Self-pay

## 2017-10-30 DIAGNOSIS — O99284 Endocrine, nutritional and metabolic diseases complicating childbirth: Secondary | ICD-10-CM | POA: Diagnosis present

## 2017-10-30 DIAGNOSIS — E039 Hypothyroidism, unspecified: Secondary | ICD-10-CM | POA: Diagnosis present

## 2017-10-30 DIAGNOSIS — Z6841 Body Mass Index (BMI) 40.0 and over, adult: Secondary | ICD-10-CM

## 2017-10-30 DIAGNOSIS — O99214 Obesity complicating childbirth: Secondary | ICD-10-CM | POA: Diagnosis present

## 2017-10-30 DIAGNOSIS — O0990 Supervision of high risk pregnancy, unspecified, unspecified trimester: Secondary | ICD-10-CM

## 2017-10-30 DIAGNOSIS — O24425 Gestational diabetes mellitus in childbirth, controlled by oral hypoglycemic drugs: Secondary | ICD-10-CM | POA: Diagnosis present

## 2017-10-30 DIAGNOSIS — O99210 Obesity complicating pregnancy, unspecified trimester: Secondary | ICD-10-CM

## 2017-10-30 DIAGNOSIS — Z3A38 38 weeks gestation of pregnancy: Secondary | ICD-10-CM

## 2017-10-30 DIAGNOSIS — O24415 Gestational diabetes mellitus in pregnancy, controlled by oral hypoglycemic drugs: Secondary | ICD-10-CM

## 2017-10-30 DIAGNOSIS — E669 Obesity, unspecified: Secondary | ICD-10-CM | POA: Diagnosis present

## 2017-10-30 DIAGNOSIS — O99280 Endocrine, nutritional and metabolic diseases complicating pregnancy, unspecified trimester: Secondary | ICD-10-CM

## 2017-10-30 DIAGNOSIS — O24419 Gestational diabetes mellitus in pregnancy, unspecified control: Secondary | ICD-10-CM | POA: Diagnosis present

## 2017-10-30 LAB — CBC
HEMATOCRIT: 42.1 % (ref 35.0–47.0)
HEMOGLOBIN: 14.5 g/dL (ref 12.0–16.0)
MCH: 28.8 pg (ref 26.0–34.0)
MCHC: 34.4 g/dL (ref 32.0–36.0)
MCV: 84 fL (ref 80.0–100.0)
Platelets: 262 10*3/uL (ref 150–440)
RBC: 5.01 MIL/uL (ref 3.80–5.20)
RDW: 14 % (ref 11.5–14.5)
WBC: 15.8 10*3/uL — ABNORMAL HIGH (ref 3.6–11.0)

## 2017-10-30 LAB — TYPE AND SCREEN
ABO/RH(D): O POS
Antibody Screen: NEGATIVE

## 2017-10-30 LAB — GLUCOSE, CAPILLARY
GLUCOSE-CAPILLARY: 73 mg/dL (ref 65–99)
GLUCOSE-CAPILLARY: 138 mg/dL — AB (ref 65–99)
GLUCOSE-CAPILLARY: 160 mg/dL — AB (ref 65–99)

## 2017-10-30 MED ORDER — ONDANSETRON HCL 4 MG/2ML IJ SOLN: 4.0000 mg | INTRAMUSCULAR | Status: DC | PRN

## 2017-10-30 MED ORDER — LEVOTHYROXINE SODIUM 50 MCG PO TABS
50.0000 ug | ORAL_TABLET | Freq: Every day | ORAL | Status: DC
  Administered 2017-10-31: 50 ug via ORAL
  Filled 2017-10-30: qty 1

## 2017-10-30 MED ORDER — ONDANSETRON HCL 4 MG/2ML IJ SOLN: 4.0000 mg | Freq: Four times a day (QID) | INTRAMUSCULAR | Status: DC | PRN

## 2017-10-30 MED ORDER — LACTATED RINGERS IV SOLN
INTRAVENOUS | Status: DC
  Administered 2017-10-30: 11:00:00 via INTRAVENOUS

## 2017-10-30 MED ORDER — PRENATAL PLUS 27-1 MG PO TABS
1.0000 | ORAL_TABLET | Freq: Every day | ORAL | Status: DC
  Administered 2017-10-31: 1 via ORAL
  Filled 2017-10-30: qty 1

## 2017-10-30 MED ORDER — LEVOTHYROXINE SODIUM 50 MCG PO TABS
50.0000 ug | ORAL_TABLET | Freq: Every day | ORAL | Status: DC
Start: 2017-10-31 — End: 2017-10-30
  Filled 2017-10-30: qty 1

## 2017-10-30 MED ORDER — COCONUT OIL OIL: 1.0000 "application " | TOPICAL_OIL | Status: DC | PRN

## 2017-10-30 MED ORDER — MISOPROSTOL 200 MCG PO TABS
ORAL_TABLET | ORAL | Status: AC
  Filled 2017-10-30: qty 4

## 2017-10-30 MED ORDER — DIBUCAINE 1 % RE OINT: 1.0000 "application " | TOPICAL_OINTMENT | RECTAL | Status: DC | PRN

## 2017-10-30 MED ORDER — FENTANYL 2.5 MCG/ML W/ROPIVACAINE 0.15% IN NS 100 ML EPIDURAL (ARMC)
EPIDURAL | Status: AC
  Filled 2017-10-30: qty 100

## 2017-10-30 MED ORDER — OXYTOCIN 40 UNITS IN LACTATED RINGERS INFUSION - SIMPLE MED
2.5000 [IU]/h | INTRAVENOUS | Status: DC
  Filled 2017-10-30: qty 1000

## 2017-10-30 MED ORDER — FERROUS SULFATE 325 (65 FE) MG PO TABS
325.0000 mg | ORAL_TABLET | Freq: Two times a day (BID) | ORAL | Status: DC
  Administered 2017-10-30 – 2017-10-31 (×3): 325 mg via ORAL
  Filled 2017-10-30 (×3): qty 1

## 2017-10-30 MED ORDER — SENNOSIDES-DOCUSATE SODIUM 8.6-50 MG PO TABS
2.0000 | ORAL_TABLET | ORAL | Status: DC
  Administered 2017-10-31: 2 via ORAL
  Filled 2017-10-30: qty 2

## 2017-10-30 MED ORDER — OXYTOCIN BOLUS FROM INFUSION: 500.0000 mL | Freq: Once | INTRAVENOUS | Status: DC

## 2017-10-30 MED ORDER — LACTATED RINGERS IV SOLN
500.0000 mL | INTRAVENOUS | Status: DC | PRN
  Administered 2017-10-30: 500 mL via INTRAVENOUS

## 2017-10-30 MED ORDER — DIPHENHYDRAMINE HCL 25 MG PO CAPS: 25.0000 mg | ORAL_CAPSULE | Freq: Four times a day (QID) | ORAL | Status: DC | PRN

## 2017-10-30 MED ORDER — FENTANYL CITRATE (PF) 100 MCG/2ML IJ SOLN
50.0000 ug | INTRAMUSCULAR | Status: DC | PRN
Start: 2017-10-30 — End: 2017-10-30

## 2017-10-30 MED ORDER — OXYTOCIN 10 UNIT/ML IJ SOLN
INTRAMUSCULAR | Status: AC
  Filled 2017-10-30: qty 2

## 2017-10-30 MED ORDER — ACETAMINOPHEN 325 MG PO TABS: 650.0000 mg | ORAL_TABLET | ORAL | Status: DC | PRN

## 2017-10-30 MED ORDER — IBUPROFEN 600 MG PO TABS
600.0000 mg | ORAL_TABLET | Freq: Four times a day (QID) | ORAL | Status: DC
  Administered 2017-10-30 – 2017-10-31 (×4): 600 mg via ORAL
  Filled 2017-10-30 (×4): qty 1

## 2017-10-30 MED ORDER — ACETAMINOPHEN 325 MG PO TABS
650.0000 mg | ORAL_TABLET | ORAL | Status: DC | PRN
  Administered 2017-10-30: 650 mg via ORAL
  Filled 2017-10-30: qty 2

## 2017-10-30 MED ORDER — BENZOCAINE-MENTHOL 20-0.5 % EX AERO
1.0000 "application " | INHALATION_SPRAY | CUTANEOUS | Status: DC | PRN
  Filled 2017-10-30: qty 56

## 2017-10-30 MED ORDER — ONDANSETRON HCL 4 MG PO TABS: 4.0000 mg | ORAL_TABLET | ORAL | Status: DC | PRN

## 2017-10-30 MED ORDER — LIDOCAINE HCL (PF) 1 % IJ SOLN
INTRAMUSCULAR | Status: AC
  Filled 2017-10-30: qty 30

## 2017-10-30 MED ORDER — AMMONIA AROMATIC IN INHA
RESPIRATORY_TRACT | Status: AC
  Filled 2017-10-30: qty 10

## 2017-10-30 MED ORDER — SIMETHICONE 80 MG PO CHEW: 80.0000 mg | CHEWABLE_TABLET | ORAL | Status: DC | PRN

## 2017-10-30 MED ORDER — WITCH HAZEL-GLYCERIN EX PADS: 1.0000 "application " | MEDICATED_PAD | CUTANEOUS | Status: DC | PRN

## 2017-10-30 MED ORDER — VARICELLA VIRUS VACCINE LIVE 1350 PFU/0.5ML IJ SUSR: 0.5000 mL | INTRAMUSCULAR | Status: DC | PRN

## 2017-10-30 NOTE — Lactation Note (Signed)
This note was copied from a baby's chart. Lactation Consultation Note  Patient Name: Carla [name] [id number] Date: 10/30/2017 Reason for consult: Initial assessment;Early term 37-38.6wks;Other (Comment)(Mom GDMA2 - Baby has tendency to suck on tongue)  Assisted mom latching Revonda Standard to left breast in football hold.  Initially, she was sucking on her tongue.  It took several attempts to get tongue down and wide open mouth.  Loud clicking noises were heard.  Broke suction and relatched.  Good rhythmic sucking noted with audible swallows.  Mom denies any nipple discomfort.  Mom reports attempting to breast feed first for about a month, but never had enough milk.  Reviewed normal course of lactation and routine newborn feeding patterns.  Explained supply and demand and need to breast feed frequently to bring in mature milk and ensure a plentiful milk supply.  Lactation name and number written on white board and encouraged to call for questions, concerns or assistance. Maternal Data Formula Feeding for Exclusion: No Has patient been taught Hand Expression?: Yes Does the patient have breastfeeding experience prior to this delivery?: Yes  Feeding Feeding Type: Breast Fed Length of feed: 30 min  LATCH Score Latch: Repeated attempts needed to sustain latch, nipple held in mouth throughout feeding, stimulation needed to elicit sucking reflex.  Audible Swallowing: Spontaneous and intermittent  Type of Nipple: Everted at rest and after stimulation  Comfort (Breast/Nipple): Soft / non-tender  Hold (Positioning): Assistance needed to correctly position infant at breast and maintain latch.  LATCH Score: 8  Interventions Interventions: Breast feeding basics reviewed;Assisted with latch;Breast massage;Hand express;Breast compression;Adjust position;Support pillows;Position options  Lactation Tools Discussed/Used WIC Program: Yes(Also has [employer])   Consult Status Consult Status:  PRN    Louis Meckel 10/30/2017, 4:49 PM

## 2017-10-30 NOTE — H&P (Signed)
OB History & Physical   History of Present Illness:  Chief Complaint: contractions  HPI:  Carla [name] is a 27 y.o. [name] female at [redacted]w[redacted]d dated by a 6 week ultrasound.  Her pregnancy has been complicated by obesity with BMI > 40, gestational diabetes requiring oral hypoglycemic medication..    She reports contractions.   She denies leakage of fluid.   She denies vaginal bleeding.   She reports fetal movement.   Her diabetes has been treated with glyburide and has not been perfectly well controlled, especially fasting BG values.  She was started on metformin 3 days ago and states her BG values have been normal in the AM (fasting). She noted 7/10 contractions starting this morning at 4 am. They have been consistently 4 minutes apart.   Maternal Medical History:   Past Medical History:  Diagnosis Date  . Hypothyroidism   . PCOS (polycystic ovarian syndrome)     Past Surgical History:  Procedure Laterality Date  . NO PAST SURGERIES      Allergies  Allergen Reactions  . Kiwi Extract   . Pineapple   . Shrimp [Shellfish Allergy]     Prior to Admission medications   Medication Sig Start Date End Date Taking? Authorizing Provider  ACCU-CHEK FASTCLIX LANCETS MISC TEST QID 08/28/17   [provider]  ACCU-CHEK GUIDE test strip TEST QID 08/28/17   [provider]  glyBURIDE (DIABETA) 5 MG tablet Take 1 tablet (5 mg total) by mouth 2 (two) times daily with a meal. 09/28/17   Conard Novak, MD  levothyroxine (SYNTHROID, LEVOTHROID) 50 MCG tablet Take 1 tablet (50 mcg total) by mouth daily before breakfast. 09/28/17   Conard Novak, MD  metFORMIN (GLUCOPHAGE) 500 MG tablet Take 1 tablet (500 mg total) by mouth at bedtime. 10/27/17   Conard Novak, MD  Prenatal Multivit-Min-Fe-FA (PRENATAL VITAMINS PO) Take 1 tablet by mouth daily.    [provider]    OB History  Gravida Para Term Preterm AB Living  2 1 1     1   SAB TAB Ectopic Multiple Live  Births        0 1    # Outcome Date GA Lbr Len/2nd Weight Sex Delivery Anes PTL Lv  2 Current           1 Term 02/16/15 [redacted]w[redacted]d / 00:30  M Vag-Spont EPI      Prenatal care site: [facility]  Social History: She  reports that she has never smoked. She has never used smokeless tobacco. She reports that she does not drink alcohol or use drugs.  Family History: She denies a family of gynecologic cancers  Review of Systems: Negative x 10 systems reviewed except as noted in the HPI.    Physical Exam:  Vital Signs: Temp 98.2 F (36.8 C) (Oral)   Resp 18   LMP 11/12/2016 (Exact Date)  Constitutional: Well nourished, well developed female in no acute distress.  HEENT: normal Skin: Warm and dry.  Cardiovascular: Regular rate and rhythm.   Extremity: no edema  Respiratory: Clear to auscultation bilateral. Normal respiratory effort Abdomen: FHT present and gravid, nt Back: no CVAT Neuro: DTRs 2+, Cranial nerves grossly intact Psych: Alert and Oriented x3. No memory deficits. Normal mood and affect.  MS: normal gait, normal bilateral lower extremity ROM/strength/stability.  Pelvic exam: (female chaperone present) 5-6/BBOW by RN  Pertinent Results:  Prenatal Labs: Blood type/Rh O positive  Antibody screen negative  Rubella Immune  Varicella Non-immune    RPR NR  HBsAg negative  HIV negative  GC negative  Chlamydia negative  Genetic screening declined  1 hour GTT Early = 129, failed 28 week = GDM  3 hour GTT 101 (a), 192(a), 166(a), 156(a)  GBS negative on 10/12/17   Baseline FHR: 160 beats/min   Variability: moderate   Accelerations: present   Decelerations: absent Contractions: present frequency: 2-3 q 10 min Overall assessment: cat 1  Assessment:  Carla [name] is a 55 y.o. G24P1001 female at [redacted]w[redacted]d with active labor, GDMA2.   Plan:  1. Admit to Labor & Delivery  2. CBC, T&S, Clrs, IVF 3. GBS negative.   4. Fetwal well-being: reassuring 5. Expectant  management 6. Monitor BG values, insulin gtt, prn.   [name], MD 10/30/2017 10:21 AM

## 2017-10-30 NOTE — Discharge Summary (Signed)
OB Discharge Summary     Patient Name: Carla [name] DOB: 04/19/1991 MRN: [id number]  Date of admission: 10/30/2017 Delivering MD: [name], MD  Date of Delivery: 10/30/2017  Date of discharge: 10/31/2017  Admitting diagnosis: 38 wks preg contractions Intrauterine pregnancy: [redacted]w[redacted]d     Secondary diagnosis: Gestational Diabetes medication controlled (A2)     Discharge diagnosis: Term Pregnancy Delivered and GDM A2                                                                                                Post partum procedures: blood glucose testing  Augmentation: none  Complications: None  Hospital course:  Onset of Labor With Vaginal Delivery     27 y.o. yo [name] at [redacted]w[redacted]d was admitted in Active Labor on 10/30/2017. Patient had an uncomplicated labor course as follows:  Membrane Rupture Time/Date: 11:45 AM ,10/30/2017   Intrapartum Procedures: Episiotomy: None [1]                                         Lacerations:  2nd degree [3]  Patient had a delivery of a Viable infant. 10/30/2017  Information for the patient's newborn:  Rey, Mccadden [[id number]]  Delivery Method: Vag-Spont    Pateint had an uncomplicated postpartum course.  She is ambulating, tolerating a regular diet, passing flatus, and urinating well. Patient is discharged home in stable condition on 10/31/17.   Physical exam  Vitals:   10/30/17 1918 10/30/17 2317 10/31/17 0416 10/31/17 0746  BP: 127/72 114/71 115/73 110/73  Pulse: 95 93 93 87  Resp:  18 18 18   Temp:  98.2 F (36.8 C) 97.8 F (36.6 C) 98 F (36.7 C)  TempSrc:  Oral Oral Oral  SpO2: 100% 99% 100% 98%  Weight:      Height:       General: alert, cooperative and no distress Lochia: appropriate Uterine Fundus: firm Incision: N/A DVT Evaluation: No evidence of DVT seen on physical exam.  Labs: Lab Results  Component Value Date   WBC 15.2 (H) 10/31/2017   HGB 12.8 10/31/2017   HCT 37.3 10/31/2017   MCV 84.3 10/31/2017   PLT  242 10/31/2017    Discharge instruction: per After Visit Summary.  Medications:  Allergies as of 10/31/2017      Reactions   Kiwi Extract    Pineapple    Shrimp [shellfish Allergy]       Medication List    TAKE these medications   glucose blood test strip Commonly known as:  ONETOUCH VERIO Use as instructed What changed:  See the new instructions.      levothyroxine 50 MCG tablet Commonly known as:  SYNTHROID, LEVOTHROID Take 1 tablet (50 mcg total) by mouth daily before breakfast.   ONETOUCH DELICA LANCETS 33G Misc Test blood sugar 4 times daily, fasting and 2 hours after meals What changed:  See the new instructions.   PRENATAL VITAMINS PO Take 1 tablet by mouth daily.  Diet: routine diet  Activity: Advance as tolerated. Pelvic rest for 6 weeks.   Outpatient follow up: Follow-up Information    Conard Novak, MD Follow up in 6 week(s).   Specialty:  Obstetrics and Gynecology Why:  schedule 2h gtt and 6 week postpartum Contact information: [address] [place] [address] [phone]             Postpartum contraception: considering Pill or Mirena Rhogam Given postpartum: NA Rubella vaccine given postpartum: Rubella Immune Varicella vaccine given postpartum: yes 1st dose prior to discharge TDaP given antepartum or postpartum: AP on 09/11/17 Influenza vaccine given: not during pregnancy  Newborn Data: Live born female  Birth Weight: 3400 g  APGAR: 8, 9  Newborn Delivery   Birth date/time:  10/30/2017 11:56:00 Delivery type:  Vaginal, Spontaneous    Baby Feeding: Breast  Disposition: home with mother  SIGNED:  Tresea Mall, CNM

## 2017-10-31 LAB — CBC
HCT: 37.3 % (ref 35.0–47.0)
Hemoglobin: 12.8 g/dL (ref 12.0–16.0)
MCH: 28.9 pg (ref 26.0–34.0)
MCHC: 34.3 g/dL (ref 32.0–36.0)
MCV: 84.3 fL (ref 80.0–100.0)
PLATELETS: 242 10*3/uL (ref 150–440)
RBC: 4.42 MIL/uL (ref 3.80–5.20)
RDW: 14.3 % (ref 11.5–14.5)
WBC: 15.2 10*3/uL — ABNORMAL HIGH (ref 3.6–11.0)

## 2017-10-31 LAB — GLUCOSE, CAPILLARY
GLUCOSE-CAPILLARY: 91 mg/dL (ref 65–99)
Glucose-Capillary: 159 mg/dL — ABNORMAL HIGH (ref 65–99)
Glucose-Capillary: 130 mg/dL — ABNORMAL HIGH (ref 65–99)

## 2017-10-31 MED ORDER — ONETOUCH DELICA LANCETS 33G MISC: 2 refills | Status: DC

## 2017-10-31 MED ORDER — VARICELLA VIRUS VACCINE LIVE 1350 PFU/0.5ML IJ SUSR
0.5000 mL | Freq: Once | INTRAMUSCULAR | Status: DC
  Filled 2017-10-31 (×2): qty 0.5

## 2017-10-31 MED ORDER — GLUCOSE BLOOD VI STRP: ORAL_STRIP | 2 refills | Status: DC

## 2017-10-31 NOTE — Plan of Care (Signed)
Vs stable; up ad lib; IV able to be removed this shift due to postpartum assessment WNL (per order); tolerating regular diet; taking motrin for pain control; some assistance with breastfeeding

## 2017-10-31 NOTE — Progress Notes (Signed)
Nurse tech in room to get blood glucose; pt had a [place] sandwich around 0200 and a few graham crackers; "I was starving"; continue to monitor sugars per order (fasting and 2 hours post prandial)

## 2017-10-31 NOTE — Progress Notes (Signed)
Discharge instructions complete and prescriptions given. Patient verbalizes understanding of teaching. Patient discharged home via wheelchair at 1750.

## 2017-10-31 NOTE — Progress Notes (Signed)
Post Partum Day 1 Subjective: Doing well, no complaints.  Tolerating regular diet, pain with PO meds, voiding and ambulating without difficulty.  No CP SOB F/C N/V or leg pain No HA, change of vision, RUQ/epigastric pain  Objective: BP 110/73 (BP Location: Left Arm)   Pulse 87   Temp 98 F (36.7 C) (Oral)   Resp 18   Ht 5\' 2"  (1.575 m)   Wt 246 lb (111.6 kg)   LMP 11/12/2016 (Exact Date)   SpO2 98%   Breastfeeding   BMI 44.99 kg/m    Physical Exam:  General: NAD CV: RRR Pulm: nl effort, CTABL Lochia: moderate Uterine Fundus: fundus firm and below umbilicus DVT Evaluation: no cords, ttp LEs   Recent Labs    10/30/17 1059 10/31/17 0519  HGB 14.5 12.8  HCT 42.1 37.3  WBC 15.8* 15.2*  PLT 262 242    Assessment/Plan: 26 y.o. G2P2002 postpartum day # 1  1. Continue routine postpartum care 2. O positive, Rubella Immune 3. Varicella Not-Immune- 1st dose varivax prior to discharge/2nd dose at 6 week visit 4. TDAP given antepartum 5. Breastfeeding/Contraception: considering pill or Mirena 6. Disposition: discharge to home later today pending newborn discharge- orders placed for mom discharge   Tresea Mall, CNM

## 2017-11-01 LAB — RPR: RPR Ser Ql: NONREACTIVE

## 2017-11-06 ENCOUNTER — Encounter: Payer: Self-pay | Admitting: Obstetrics and Gynecology

## 2017-11-08 ENCOUNTER — Inpatient Hospital Stay: Admit: 2017-11-08 | Payer: Self-pay

## 2017-12-12 ENCOUNTER — Other Ambulatory Visit (HOSPITAL_COMMUNITY)
Admission: RE | Admit: 2017-12-12 | Discharge: 2017-12-12 | Disposition: A | Discharge status: Home / Self Care | Payer: BC Managed Care – PPO | Source: Ambulatory Visit | Attending: Obstetrics and Gynecology | Admitting: Obstetrics and Gynecology

## 2017-12-12 ENCOUNTER — Encounter: Payer: Self-pay | Admitting: Obstetrics and Gynecology

## 2017-12-12 ENCOUNTER — Ambulatory Visit (INDEPENDENT_AMBULATORY_CARE_PROVIDER_SITE_OTHER): Payer: BC Managed Care – PPO | Admitting: Obstetrics and Gynecology

## 2017-12-12 ENCOUNTER — Other Ambulatory Visit: Payer: BC Managed Care – PPO

## 2017-12-12 DIAGNOSIS — Z30430 Encounter for insertion of intrauterine contraceptive device: Secondary | ICD-10-CM

## 2017-12-12 DIAGNOSIS — Z113 Encounter for screening for infections with a predominantly sexual mode of transmission: Secondary | ICD-10-CM

## 2017-12-12 DIAGNOSIS — Z124 Encounter for screening for malignant neoplasm of cervix: Secondary | ICD-10-CM

## 2017-12-12 DIAGNOSIS — Z8632 Personal history of gestational diabetes: Secondary | ICD-10-CM

## 2017-12-12 NOTE — Progress Notes (Signed)
Postpartum Visit   Chief Complaint  Patient presents with  . Postpartum Care    6wpp and 2 hr GTT   History of Present Illness: Patient is a 27 y.o. [id number] presents for postpartum visit.  Date of delivery: 10/30/17 Type of delivery: Vaginal delivery - Vacuum or forceps assisted  no Episiotomy No.  Laceration: yes - 2nd degree Pregnancy or labor problems:  Yes - GDMA2 Any problems since the delivery:  no  Newborn Details:  SINGLETON :  1. Baby's name: Revonda Standard, Birth weight: 3,400 grams Maternal Details:  Breast Feeding:  no Post partum depression/anxiety noted:  no Edinburgh Post-Partum Depression Score:  0   Date of last PAP: 2016  normal   Past Medical History:  Diagnosis Date  . Hypothyroidism   . PCOS (polycystic ovarian syndrome)     Past Surgical History:  Procedure Laterality Date  . NO PAST SURGERIES      Prior to Admission medications   Medication Sig Start Date End Date Taking? Authorizing Provider  glucose blood (ONETOUCH VERIO) test strip Use as instructed 10/31/17  Yes Tresea Mall, CNM  levothyroxine (SYNTHROID, LEVOTHROID) 50 MCG tablet Take 1 tablet (50 mcg total) by mouth daily before breakfast. 09/28/17  Yes Conard Novak, MD  [facility] DELICA LANCETS 33G MISC Test blood sugar 4 times daily, fasting and 2 hours after meals 10/31/17  Yes Tresea Mall, CNM  Prenatal Multivit-Min-Fe-FA (PRENATAL VITAMINS PO) Take 1 tablet by mouth daily.   Yes [provider]    Allergies  Allergen Reactions  . Kiwi Extract   . Pineapple   . Shrimp [Shellfish Allergy]      Social History   Socioeconomic History  . Marital status: Married    Spouse name: Not on file  . Number of children: Not on file  . Years of education: Not on file  . Highest education level: Not on file  Occupational History  . Not on file  Social Needs  . Financial resource strain: Not on file  . Food insecurity:    Worry: Not on file    Inability: Not on file  .  Transportation needs:    Medical: Not on file    Non-medical: Not on file  Tobacco Use  . Smoking status: Never Smoker  . Smokeless tobacco: Never Used  Substance and Sexual Activity  . Alcohol use: No  . Drug use: No  . Sexual activity: Yes    Birth control/protection: None  Lifestyle  . Physical activity:    Days per week: Not on file    Minutes per session: Not on file  . Stress: Not on file  Relationships  . Social connections:    Talks on phone: Not on file    Gets together: Not on file    Attends religious service: Not on file    Active member of club or organization: Not on file    Attends meetings of clubs or organizations: Not on file    Relationship status: Not on file  . Intimate partner violence:    Fear of current or ex partner: Not on file    Emotionally abused: Not on file    Physically abused: Not on file    Forced sexual activity: Not on file  Other Topics Concern  . Not on file  Social History Narrative  . Not on file    History reviewed. No pertinent family history.  Review of Systems  Constitutional: Negative.   HENT: Negative.  Eyes: Negative.   Respiratory: Negative.   Cardiovascular: Negative.   Gastrointestinal: Negative.   Genitourinary: Negative.   Musculoskeletal: Negative.   Skin: Negative.   Neurological: Negative.   Psychiatric/Behavioral: Negative.      Physical Exam BP 124/82   Pulse 93   Ht 5\' 2"  (1.575 m)   Wt 236 lb (107 kg)   Breastfeeding? No   BMI 43.16 kg/m   Physical Exam  Constitutional: She is oriented to person, place, and time. She appears well-developed and well-nourished. No distress.  Genitourinary: Vagina normal and uterus normal. Pelvic exam was performed with patient supine. There is no rash, tenderness or lesion on the right labia. There is no rash, tenderness or lesion on the left labia. Right adnexum does not display mass, does not display tenderness and does not display fullness. Left adnexum does not  display mass, does not display tenderness and does not display fullness. Cervix does not exhibit motion tenderness, lesion or polyp.   Uterus is mobile and midaxial and anteverted. Uterus is not enlarged, tender, exhibiting a mass or irregular (is regular).  HENT:  Head: Normocephalic and atraumatic.  Eyes: EOM are normal. No scleral icterus.  Neck: Normal range of motion. Neck supple.  Cardiovascular: Normal rate and regular rhythm.  Pulmonary/Chest: Effort normal and breath sounds normal. No respiratory distress. She has no wheezes. She has no rales.  Abdominal: Soft. Bowel sounds are normal. She exhibits no distension and no mass. There is no tenderness. There is no rebound and no guarding.  Musculoskeletal: Normal range of motion. She exhibits no edema.  Neurological: She is alert and oriented to person, place, and time. No cranial nerve deficit.  Skin: Skin is warm and dry. No erythema.  Psychiatric: She has a normal mood and affect. Her behavior is normal. Judgment normal.    IUD Insertion Procedure Note Patient identified, informed consent performed, consent signed.   Discussed risks of irregular bleeding, cramping, infection, malpositioning, expulsion or uterine perforation of the IUD (1:1000 placements)  which may require further procedure such as laparoscopy.  IUD while effective at preventing pregnancy do not prevent transmission of sexually transmitted diseases and use of barrier methods for this purpose was discussed. Time out was performed.    Speculum placed in the vagina.  Cervix visualized.  Cleaned with Betadine x 2.  Grasped anteriorly with a single tooth tenaculum.  Uterus sounded to 8.5 cm. IUD placed per manufacturer's recommendations.  Strings trimmed to 3 cm. Tenaculum was removed, good hemostasis noted after application of silver nitrate.  Patient tolerated procedure well.   Patient was given post-procedure instructions.  She was advised to have backup contraception for  one week.  Patient was also asked to check IUD strings periodically and follow up in 4 weeks for IUD check.   Female Chaperone present during breast and/or pelvic exam.  Assessment: 27 y.o. [id number] presenting for 6 week postpartum visit  Plan: Problem List Items Addressed This Visit    None    Visit Diagnoses    Postpartum care and examination    -  Primary   Relevant Orders   Cytology - PAP   History of gestational diabetes       Relevant Orders   Glucose Tolerance, 2 Hours w/1 Hour   Pap smear for cervical cancer screening       Relevant Orders   Cytology - PAP   Screen for STD (sexually transmitted disease)       Relevant Orders  Cytology - PAP   Encounter for IUD insertion         1) Contraception Education given regarding options for contraception, including IUD placement.  2)  Pap - ASCCP guidelines and rational discussed.  Patient opts for routine screening interval.  Due today. So, placed. See note above.  3) Patient underwent screening for postpartum depression with no concerns noted.  4) GDMA2 during pregnancy: 2 hour gtt today.   Return in about 1 month (around 01/09/2018) for IUD string check.   [name], MD 12/12/2017 10:16 AM

## 2017-12-12 NOTE — Patient Instructions (Signed)

## 2017-12-13 LAB — GLUCOSE TOLERANCE, 2 HOURS W/ 1HR
GLUCOSE, 1 HOUR: 144 mg/dL (ref 65–179)
Glucose, 2 hour: 111 mg/dL (ref 65–152)
Glucose, Fasting: 103 mg/dL — ABNORMAL HIGH (ref 65–91)

## 2017-12-13 LAB — CYTOLOGY - PAP
Chlamydia: NEGATIVE
DIAGNOSIS: NEGATIVE
Neisseria Gonorrhea: NEGATIVE

## 2018-01-09 ENCOUNTER — Ambulatory Visit: Payer: BC Managed Care – PPO | Admitting: Obstetrics and Gynecology

## 2018-03-23 ENCOUNTER — Ambulatory Visit (INDEPENDENT_AMBULATORY_CARE_PROVIDER_SITE_OTHER): Payer: BC Managed Care – PPO | Admitting: Obstetrics and Gynecology

## 2018-03-23 ENCOUNTER — Encounter: Payer: Self-pay | Admitting: Obstetrics and Gynecology

## 2018-03-23 VITALS — BP 118/74 | Ht 62.0 in | Wt 238.0 lb

## 2018-03-23 DIAGNOSIS — R102 Pelvic and perineal pain: Secondary | ICD-10-CM

## 2018-03-23 DIAGNOSIS — R1031 Right lower quadrant pain: Secondary | ICD-10-CM

## 2018-03-23 NOTE — Progress Notes (Signed)
Obstetrics & Gynecology Office Visit   Chief Complaint  Patient presents with  . Pelvic Pain   History of Present Illness: 27 y.o. G75P2002 female who presents for right sided pelvic pain.  The pain started Monday (4 days ago). She describes the pain as stabbing.  Her pain level is 6-7/10. The pain radiates around her back.  The pain is not constant.  Nothing makes the pain feel better.  Aggravating factors: bending over.  The pain lasts for 2-3 minutes when it occurs.  She denies fevers, chills, nausea, vomiting, diarrhea, constipation, dysuria, hematuria. The pain is not affected by having a bowel movement or eating.  She is formula feeding her 47 month old.  She has an IUD.  She has had no bleeding for the past two months.  She has no menstrual.  Denies painful intercourse. She has not had intercourse since Monday.  To her the pain is like pain she had when she had a cyst on her right ovary.    Past Medical History:  Diagnosis Date  . Hypothyroidism   . PCOS (polycystic ovarian syndrome)     Past Surgical History:  Procedure Laterality Date  . NO PAST SURGERIES      Gynecologic History: No LMP recorded.  Obstetric History: [id number]  Family History:  Denies history of gynecologic cancer  Social History   Socioeconomic History  . Marital status: Married    Spouse name: Not on file  . Number of children: Not on file  . Years of education: Not on file  . Highest education level: Not on file  Occupational History  . Not on file  Social Needs  . Financial resource strain: Not on file  . Food insecurity:    Worry: Not on file    Inability: Not on file  . Transportation needs:    Medical: Not on file    Non-medical: Not on file  Tobacco Use  . Smoking status: Never Smoker  . Smokeless tobacco: Never Used  Substance and Sexual Activity  . Alcohol use: No  . Drug use: No  . Sexual activity: Yes    Birth control/protection: None  Lifestyle  . Physical activity:    Days  per week: Not on file    Minutes per session: Not on file  . Stress: Not on file  Relationships  . Social connections:    Talks on phone: Not on file    Gets together: Not on file    Attends religious service: Not on file    Active member of club or organization: Not on file    Attends meetings of clubs or organizations: Not on file    Relationship status: Not on file  . Intimate partner violence:    Fear of current or ex partner: Not on file    Emotionally abused: Not on file    Physically abused: Not on file    Forced sexual activity: Not on file  Other Topics Concern  . Not on file  Social History Narrative  . Not on file    Allergies  Allergen Reactions  . Kiwi Extract   . Pineapple   . Shrimp [Shellfish Allergy]     Prior to Admission medications   Medication Sig Start Date End Date Taking? Authorizing Provider  levonorgestrel (MIRENA) 20 MCG/24HR IUD 1 each by Intrauterine route once.   Yes [provider]  levothyroxine (SYNTHROID, LEVOTHROID) 50 MCG tablet Take 1 tablet (50 mcg total) by mouth daily before breakfast.  09/28/17  Yes Conard Novak, MD  glucose blood [facility] VERIO) test strip Use as instructed Patient not taking: Reported on 03/23/2018 10/31/17   Tresea Mall, CNM  [facility] DELICA LANCETS 33G MISC Test blood sugar 4 times daily, fasting and 2 hours after meals Patient not taking: Reported on 03/23/2018 10/31/17   Tresea Mall, CNM  Prenatal Multivit-Min-Fe-FA (PRENATAL VITAMINS PO) Take 1 tablet by mouth daily.    [provider]    Review of Systems  Constitutional: Negative.   HENT: Negative.   Eyes: Negative.   Respiratory: Negative.   Cardiovascular: Negative.   Gastrointestinal: Positive for abdominal pain. Negative for blood in stool, constipation, diarrhea, heartburn, melena, nausea and vomiting.  Genitourinary: Negative.   Musculoskeletal: Negative.   Skin: Negative.   Neurological: Negative.     Psychiatric/Behavioral: Negative.      Physical Exam BP 118/74   Ht 5\' 2"  (1.575 m)   Wt 238 lb (108 kg)   BMI 43.53 kg/m  No LMP recorded. Physical Exam  Constitutional: She is oriented to person, place, and time. She appears well-developed and well-nourished. No distress.  Genitourinary: Vagina normal and uterus normal. Pelvic exam was performed with patient supine. There is no rash, tenderness, lesion or injury on the right labia. There is no rash, tenderness, lesion or injury on the left labia.  Right adnexum displays tenderness. Right adnexum does not display mass and does not display fullness. Left adnexum does not display mass, does not display tenderness and does not display fullness.  Cervix exhibits visible IUD strings. Cervix does not exhibit motion tenderness, lesion, polyp or nabothian cyst.  HENT:  Head: Normocephalic and atraumatic.  Eyes: EOM are normal. No scleral icterus.  Neck: Normal range of motion. Neck supple.  Cardiovascular: Normal rate and regular rhythm.  Pulmonary/Chest: Effort normal and breath sounds normal. No respiratory distress. She has no wheezes. She has no rales.  Abdominal: Soft. Bowel sounds are normal. She exhibits no distension and no mass. There is no tenderness. There is no rebound and no guarding.  Musculoskeletal: Normal range of motion. She exhibits no edema.  Neurological: She is alert and oriented to person, place, and time. No cranial nerve deficit.  Skin: Skin is warm and dry. No erythema.  Psychiatric: She has a normal mood and affect. Her behavior is normal. Judgment normal.   Female chaperone present for pelvic and breast  portions of the physical exam  Assessment: 27 y.o. G62P2002 female here for  1. Acute pelvic pain      Plan: Problem List Items Addressed This Visit    None    Visit Diagnoses    Acute pelvic pain    -  Primary   Relevant Orders   US PELVIS TRANSVANGINAL NON-OB (TV ONLY)     Discussed the multiple  possible etiologies for right lower quadrant abdominal and pelvic pain.  Will obtain a pelvic ultrasound to assess for ovarian and adnexal etiologies, as well as ensure correct positioning of her IUD.  Most likely etiology, though, seems musculoskeletal.  Discussed precautions for appendicitis or worsening pain, such as fever, chills, nausea, worsening pain, or any other concerning symptoms. 8 20 minutes spent in face to face discussion with > 50% spent in counseling,management, and coordination of care of her right lower quadrant abdominal pain and acute pelvic pain.  [name], MD 03/23/2018 9:23 AM

## 2018-03-27 ENCOUNTER — Encounter: Payer: Self-pay | Admitting: Obstetrics and Gynecology

## 2018-03-27 ENCOUNTER — Ambulatory Visit (INDEPENDENT_AMBULATORY_CARE_PROVIDER_SITE_OTHER): Payer: BC Managed Care – PPO

## 2018-03-27 ENCOUNTER — Ambulatory Visit (INDEPENDENT_AMBULATORY_CARE_PROVIDER_SITE_OTHER): Payer: BC Managed Care – PPO | Admitting: Obstetrics and Gynecology

## 2018-03-27 VITALS — BP 124/82 | Ht 62.0 in | Wt 248.0 lb

## 2018-03-27 DIAGNOSIS — R102 Pelvic and perineal pain unspecified side: Secondary | ICD-10-CM

## 2018-03-27 NOTE — Progress Notes (Signed)
Gynecology Ultrasound Follow Up   Chief Complaint  Patient presents with  . Follow-up  Pelvic pain   History of Present Illness: Patient is a 27 y.o. female who presents today for ultrasound evaluation of the above .  Ultrasound demonstrates the following findings Adnexa: no discreet masses noted, multiple follicles consistent with PCOS  Uterus: anteverted with endometrial stripe  5.0 mm Additional: IUD appears to be in the correct location within the uterus  She continues to have pain that is only provoked by bending over or sitting for long periods at a time.  It does not appear to be getting worse or better.  She has tried heating pad, but that does not seem to help.  She has taken no medication for the pain.  Past Medical History:  Diagnosis Date  . Hypothyroidism   . PCOS (polycystic ovarian syndrome)     Past Surgical History:  Procedure Laterality Date  . NO PAST SURGERIES      Social History   Socioeconomic History  . Marital status: Married    Spouse name: Not on file  . Number of children: Not on file  . Years of education: Not on file  . Highest education level: Not on file  Occupational History  . Not on file  Social Needs  . Financial resource strain: Not on file  . Food insecurity:    Worry: Not on file    Inability: Not on file  . Transportation needs:    Medical: Not on file    Non-medical: Not on file  Tobacco Use  . Smoking status: Never Smoker  . Smokeless tobacco: Never Used  Substance and Sexual Activity  . Alcohol use: No  . Drug use: No  . Sexual activity: Yes    Birth control/protection: None  Lifestyle  . Physical activity:    Days per week: Not on file    Minutes per session: Not on file  . Stress: Not on file  Relationships  . Social connections:    Talks on phone: Not on file    Gets together: Not on file    Attends religious service: Not on file    Active member of club or organization: Not on file    Attends meetings  of clubs or organizations: Not on file    Relationship status: Not on file  . Intimate partner violence:    Fear of current or ex partner: Not on file    Emotionally abused: Not on file    Physically abused: Not on file    Forced sexual activity: Not on file  Other Topics Concern  . Not on file  Social History Narrative  . Not on file    Allergies  Allergen Reactions  . Kiwi Extract   . Pineapple   . Shrimp [Shellfish Allergy]     Prior to Admission medications   Medication Sig Start Date End Date Taking? Authorizing Provider  glucose blood (ONETOUCH VERIO) test strip Use as instructed Patient not taking: Reported on 03/23/2018 10/31/17   Tresea Mall, CNM  levonorgestrel (MIRENA) 20 MCG/24HR IUD 1 each by Intrauterine route once.    [provider]  levothyroxine (SYNTHROID, LEVOTHROID) 50 MCG tablet Take 1 tablet (50 mcg total) by mouth daily before breakfast. 09/28/17   Conard Novak, MD  [facility] DELICA LANCETS 33G MISC Test blood sugar 4 times daily, fasting and 2 hours after meals Patient not taking: Reported on 03/23/2018 10/31/17   Tresea Mall, CNM  Prenatal Multivit-Min-Fe-FA (PRENATAL VITAMINS PO) Take 1 tablet by mouth daily.    [provider]    Physical Exam BP 124/82   Ht 5\' 2"  (1.575 m)   Wt 248 lb (112.5 kg)   BMI 45.36 kg/m    General: NAD HEENT: normocephalic, anicteric Pulmonary: No increased work of breathing Extremities: no edema, erythema, or tenderness Neurologic: Grossly intact, normal gait Psychiatric: mood appropriate, affect full  Imaging Results: US Pelvis Transvanginal Non-ob (tv Only)  Result Date: 03/27/2018 Patient Name: RAYOLA FLYTHE DOB: 27-May-1991 MRN: [id number] ULTRASOUND REPORT Location: [facility] Date of Service: 03/27/2018 Indications:Pelvic Pain Findings: The uterus is anteverted and measures 8.1 x 5.1 x 3.5 cm. Echo texture is homogenous without evidence of focal masses. The Endometrium measures  5.0 mm. IUD in correct location within the endometrial cavity. Right Ovary measures 3.3 x 2.1 x 2.5 cm. Left Ovary measures 2.9 x 1.9 x 2.3 cm. Bilateral ovaries have multiple small peripheral follicles. Ultrasound appearance suggestive of PCOS. Survey of the adnexa demonstrates no adnexal masses. There is no free fluid in the cul de sac. Impression: 1. IUD in place 2. Ultrasound appearance of PCOS. 3. Otherwise, normal pelvic ultrasound Darlina Guys, RDMS RVT The ultrasound images and findings were reviewed by me and I agree with the above report. [name], MD, [facility], [facility] 03/27/2018 8:36 AM       Assessment: 27 y.o. G2P2002  1. Acute pelvic pain      Plan: Problem List Items Addressed This Visit    None    Visit Diagnoses    Acute pelvic pain    -  Primary     I reviewed the results of the ultrasound with the patient.  Reassured her that this is most likely a musculoskeletal etiology based on the way the symptoms are described.  It is easily reproducible with bending over.  It does not occur unprovoked.  Discussed conservative measures for treatment.  If the pain gets worse, may consider physical therapy.  If other symptoms develop, the patient will notify me as this may give some insight into another, alternative etiology.  15 minutes spent in face to face discussion with > 50% spent in counseling,management, and coordination of care of her acute pelvic pain.  [name], MD, [facility], [facility] 03/27/2018 8:45 AM

## 2018-06-11 ENCOUNTER — Ambulatory Visit (INDEPENDENT_AMBULATORY_CARE_PROVIDER_SITE_OTHER): Payer: BC Managed Care – PPO | Admitting: Family Medicine

## 2018-06-11 ENCOUNTER — Encounter: Payer: Self-pay | Admitting: Family Medicine

## 2018-06-11 VITALS — BP 122/85 | HR 85 | Temp 97.9°F | Ht 63.3 in | Wt 243.0 lb

## 2018-06-11 DIAGNOSIS — R202 Paresthesia of skin: Secondary | ICD-10-CM

## 2018-06-11 DIAGNOSIS — Z6841 Body Mass Index (BMI) 40.0 and over, adult: Secondary | ICD-10-CM

## 2018-06-11 DIAGNOSIS — Z7689 Persons encountering health services in other specified circumstances: Secondary | ICD-10-CM | POA: Diagnosis not present

## 2018-06-11 DIAGNOSIS — E669 Obesity, unspecified: Secondary | ICD-10-CM | POA: Insufficient documentation

## 2018-06-11 DIAGNOSIS — M79609 Pain in unspecified limb: Secondary | ICD-10-CM

## 2018-06-11 DIAGNOSIS — E039 Hypothyroidism, unspecified: Secondary | ICD-10-CM | POA: Diagnosis not present

## 2018-06-11 MED ORDER — CYCLOBENZAPRINE HCL 5 MG PO TABS: 5.0000 mg | ORAL_TABLET | Freq: Every evening | ORAL | 1 refills | Status: DC | PRN

## 2018-06-11 NOTE — Assessment & Plan Note (Signed)
Discussed lifestyle modifications, low impact exercises. Refused dietician referral. Will continue to monitor weight

## 2018-06-11 NOTE — Assessment & Plan Note (Signed)
Recheck TSH today and adjust as needed

## 2018-06-11 NOTE — Progress Notes (Signed)
BP 122/85 (BP Location: Right Arm, Patient Position: Sitting, Cuff Size: Large)   Pulse 85   Temp 97.9 F (36.6 C)   Ht 5' 3.3" (1.608 m)   Wt 243 lb (110.2 kg)   SpO2 98%   BMI 42.64 kg/m    Subjective:    Patient ID: Carla [name], female    DOB: 12/29/1990, 28 y.o.   MRN: [id number]  HPI: Carla ManesLiah V Novack is a 28 y.o. female  Chief Complaint  Patient presents with  . Establish Care  . Hypothyroidism   Here today to establish care.   Hypothyroidism since age 28, stable on 50 mcg of synthroid. Last checked 08/2017. Asymptomatic.   Concerned about her weight. Typical pre-pregnancy weight is around 213 lb and now weighing 243 despite working on her diet. Has become intolerant to dairy products since pregnancy and avoids them. Had gestational diabetes and is still trying to follow a low carb diet. Not currently exercising due to some significant issues with sciatica.   Has had issues with sciatica since she's been pregnant over the past year and it's not going away 7 months post-partum. Worked with a Landchiropractor during pregnancy and will restart that in 2 months. Now also since pregnancy developing full right sided numbness, tingling, weakness that she describes as radiating upward from her hip/sciatica issues. States she can't even hold her baby on the right side. Very concerned about this issue. Trying stretches, heat anti inflammatories with no relief. No back injuries, fevers, incontinence issues. Taking B12 and magnesium with no relief.   Relevant past medical, surgical, family and social history reviewed and updated as indicated. Interim medical history since our last visit reviewed. Allergies and medications reviewed and updated.  Review of Systems  Per HPI unless specifically indicated above     Objective:    BP 122/85 (BP Location: Right Arm, Patient Position: Sitting, Cuff Size: Large)   Pulse 85   Temp 97.9 F (36.6 C)   Ht 5' 3.3" (1.608 m)   Wt 243 lb (110.2  kg)   SpO2 98%   BMI 42.64 kg/m   Wt Readings from Last 3 Encounters:  06/11/18 243 lb (110.2 kg)  03/27/18 248 lb (112.5 kg)  03/23/18 238 lb (108 kg)    Physical Exam Vitals signs and nursing note reviewed.  Constitutional:      Appearance: Normal appearance. She is obese. She is not ill-appearing.  HENT:     Head: Atraumatic.  Eyes:     Extraocular Movements: Extraocular movements intact.     Conjunctiva/sclera: Conjunctivae normal.  Neck:     Musculoskeletal: Normal range of motion and neck supple.  Cardiovascular:     Rate and Rhythm: Normal rate and regular rhythm.     Pulses: Normal pulses.     Heart sounds: Normal heart sounds.  Pulmonary:     Effort: Pulmonary effort is normal.     Breath sounds: Normal breath sounds.  Musculoskeletal: Normal range of motion.        General: No tenderness or signs of injury.     Comments: - SLR b/l  Skin:    General: Skin is warm and dry.  Neurological:     General: No focal deficit present.     Mental Status: She is alert and oriented to person, place, and time.     Sensory: No sensory deficit.     Coordination: Coordination normal.     Gait: Gait normal.     Comments:  Grip strength equal B/l hands  Psychiatric:        Mood and Affect: Mood normal.        Thought Content: Thought content normal.        Judgment: Judgment normal.     Results for orders placed or performed in visit on 12/12/17  Glucose Tolerance, 2 Hours w/1 Hour  Result Value Ref Range   Glucose, Fasting 103 (H) 65 - 91 mg/dL   Glucose, 1 hour 909 65 - 179 mg/dL   Glucose, 2 hour 311 65 - 152 mg/dL  Cytology - PAP  Result Value Ref Range   Adequacy      Satisfactory for evaluation  endocervical/transformation zone component PRESENT.   Diagnosis      NEGATIVE FOR INTRAEPITHELIAL LESIONS OR MALIGNANCY.   Chlamydia Negative    Neisseria gonorrhea Negative    Material Submitted CervicoVaginal Pap [ThinPrep Imaged]    CYTOLOGY - PAP PAP RESULT         Assessment & Plan:   Problem List Items Addressed This Visit      Endocrine   Acquired hypothyroidism - Primary    Recheck TSH today and adjust as needed      Relevant Orders   TSH     Other   Obesity    Discussed lifestyle modifications, low impact exercises. Refused dietician referral. Will continue to monitor weight       Other Visit Diagnoses    Encounter to establish care       Paresthesia and pain of right extremity       Will refer to Neuro for further studies given failure of conservative measures. Stretches, weight loss, NSAIDs prn   Relevant Orders   Ambulatory referral to Neurology       Follow up plan: Return for CPE.

## 2018-06-12 ENCOUNTER — Other Ambulatory Visit: Payer: Self-pay | Admitting: Family Medicine

## 2018-06-12 DIAGNOSIS — O99280 Endocrine, nutritional and metabolic diseases complicating pregnancy, unspecified trimester: Principal | ICD-10-CM

## 2018-06-12 DIAGNOSIS — E039 Hypothyroidism, unspecified: Secondary | ICD-10-CM

## 2018-06-12 LAB — TSH: TSH: 3.74 u[IU]/mL (ref 0.450–4.500)

## 2018-06-12 MED ORDER — LEVOTHYROXINE SODIUM 50 MCG PO TABS: 50.0000 ug | ORAL_TABLET | Freq: Every day | ORAL | 5 refills | Status: DC

## 2018-06-26 ENCOUNTER — Ambulatory Visit (INDEPENDENT_AMBULATORY_CARE_PROVIDER_SITE_OTHER): Payer: BC Managed Care – PPO | Admitting: Family Medicine

## 2018-06-26 ENCOUNTER — Encounter: Payer: Self-pay | Admitting: Family Medicine

## 2018-06-26 VITALS — BP 109/75 | HR 95 | Temp 98.6°F | Ht 64.0 in | Wt 245.5 lb

## 2018-06-26 DIAGNOSIS — Z0000 Encounter for general adult medical examination without abnormal findings: Secondary | ICD-10-CM | POA: Diagnosis not present

## 2018-06-26 DIAGNOSIS — E039 Hypothyroidism, unspecified: Secondary | ICD-10-CM

## 2018-06-26 DIAGNOSIS — G43909 Migraine, unspecified, not intractable, without status migrainosus: Secondary | ICD-10-CM | POA: Diagnosis not present

## 2018-06-26 LAB — UA/M W/RFLX CULTURE, ROUTINE
BILIRUBIN UA: NEGATIVE
Glucose, UA: NEGATIVE
Ketones, UA: NEGATIVE
Leukocytes, UA: NEGATIVE
Nitrite, UA: NEGATIVE
PH UA: 6 (ref 5.0–7.5)
Protein, UA: NEGATIVE
RBC UA: NEGATIVE
SPEC GRAV UA: 1.025 (ref 1.005–1.030)
UUROB: 0.2 mg/dL (ref 0.2–1.0)

## 2018-06-26 MED ORDER — SUMATRIPTAN SUCCINATE 50 MG PO TABS: ORAL_TABLET | ORAL | 0 refills | Status: DC

## 2018-06-26 NOTE — Assessment & Plan Note (Signed)
Will start imitrex prn and monitor for improvement. Discussed keeping migraine diary to help identify triggers. If becoming more frequent or not improved by imitrex, may look into preventative medications

## 2018-06-26 NOTE — Assessment & Plan Note (Signed)
Recent TSH WNL, continue 50 mcg synthroid

## 2018-06-26 NOTE — Progress Notes (Signed)
BP 109/75 (BP Location: Right Arm, Patient Position: Sitting, Cuff Size: Normal)   Pulse 95   Temp 98.6 F (37 C) (Oral)   Ht 5\' 4"  (1.626 m)   Wt 245 lb 8 oz (111.4 kg)   SpO2 98%   BMI 42.14 kg/m    Subjective:    Patient ID: Carla [name], female    DOB: 06/23/1990, 28 y.o.   MRN: [id number]  HPI: Carla [name] is a 28 y.o. female presenting on 06/26/2018 for comprehensive medical examination. Current medical complaints include:see below  Hx of migraines. Currently getting 2-3 migraines monthly, tylenol and Excedrin not helping. Gets nauseated right before one comes on but otherwise no major nausea, vomiting, photophobia, phonophobia. Has never taken anything prescription wise for them.   She currently lives with: Menopausal Symptoms: no  Depression Screen done today and results listed below:  [facility] 06/26/2018 06/11/2018 08/28/2017  Decreased Interest 0 0 0  Down, Depressed, Hopeless 0 0 0  PHQ - 2 Score 0 0 0  Altered sleeping 1 - -  Tired, decreased energy 0 - -  Change in appetite 1 - -  Feeling bad or failure about yourself  0 - -  Trouble concentrating 0 - -  Moving slowly or fidgety/restless 0 - -  Suicidal thoughts 0 - -  PHQ-9 Score 2 - -    The patient does not have a history of falls. I did not complete a risk assessment for falls. A plan of care for falls was not documented.   Past Medical History:  Past Medical History:  Diagnosis Date  . Allergy   . Anxiety   . Back pain with sciatica    Right  . Gestational diabetes   . Hypothyroidism   . PCOS (polycystic ovarian syndrome)     Surgical History:  Past Surgical History:  Procedure Laterality Date  . NO PAST SURGERIES      Medications:  Current Outpatient Medications on File Prior to Visit  Medication Sig  . cyclobenzaprine (FLEXERIL) 5 MG tablet Take 1 tablet (5 mg total) by mouth at bedtime as needed for muscle spasms.  Marland Kitchen. levonorgestrel (MIRENA) 20 MCG/24HR IUD 1 each by  Intrauterine route once.  Marland Kitchen. levothyroxine (SYNTHROID, LEVOTHROID) 50 MCG tablet Take 1 tablet (50 mcg total) by mouth daily before breakfast.   No current facility-administered medications on file prior to visit.     Allergies:  Allergies  Allergen Reactions  . Kiwi Extract   . Pineapple   . Shrimp [Shellfish Allergy]     Social History:  Social History   Socioeconomic History  . Marital status: Married    Spouse name: Not on file  . Number of children: Not on file  . Years of education: Not on file  . Highest education level: Not on file  Occupational History  . Not on file  Social Needs  . Financial resource strain: Not on file  . Food insecurity:    Worry: Not on file    Inability: Not on file  . Transportation needs:    Medical: Not on file    Non-medical: Not on file  Tobacco Use  . Smoking status: Never Smoker  . Smokeless tobacco: Never Used  Substance and Sexual Activity  . Alcohol use: No  . Drug use: No  . Sexual activity: Yes    Birth control/protection: None, I.U.D.  Lifestyle  . Physical activity:    Days per week: Not on  file    Minutes per session: Not on file  . Stress: Not on file  Relationships  . Social connections:    Talks on phone: Not on file    Gets together: Not on file    Attends religious service: Not on file    Active member of club or organization: Not on file    Attends meetings of clubs or organizations: Not on file    Relationship status: Not on file  . Intimate partner violence:    Fear of current or ex partner: Not on file    Emotionally abused: Not on file    Physically abused: Not on file    Forced sexual activity: Not on file  Other Topics Concern  . Not on file  Social History Narrative  . Not on file   Social History   Tobacco Use  Smoking Status Never Smoker  Smokeless Tobacco Never Used   Social History   Substance and Sexual Activity  Alcohol Use No    Family History:  Family History  Problem  Relation Age of Onset  . Other Mother        Spinal stenosis  . Hypertension Mother   . Asthma Daughter   . Asthma Son   . Other Maternal Grandmother        Spinal Stenosis  . Cataracts Maternal Grandfather   . Cancer Paternal Grandmother        Breast   . Diabetes Paternal Grandfather     Past medical history, surgical history, medications, allergies, family history and social history reviewed with patient today and changes made to appropriate areas of the chart.   Review of Systems - General ROS: negative Psychological ROS: negative Ophthalmic ROS: negative ENT ROS: negative Allergy and Immunology ROS: negative Hematological and Lymphatic ROS: negative Endocrine ROS: negative Breast ROS: negative for breast lumps Respiratory ROS: no cough, shortness of breath, or wheezing Cardiovascular ROS: no chest pain or dyspnea on exertion Gastrointestinal ROS: no abdominal pain, change in bowel habits, or black or bloody stools Genito-Urinary ROS: no dysuria, trouble voiding, or hematuria Musculoskeletal ROS: negative Neurological ROS: positive for - headaches Dermatological ROS: negative All other ROS negative except what is listed above and in the HPI.      Objective:    BP 109/75 (BP Location: Right Arm, Patient Position: Sitting, Cuff Size: Normal)   Pulse 95   Temp 98.6 F (37 C) (Oral)   Ht 5\' 4"  (1.626 m)   Wt 245 lb 8 oz (111.4 kg)   SpO2 98%   BMI 42.14 kg/m   Wt Readings from Last 3 Encounters:  06/26/18 245 lb 8 oz (111.4 kg)  06/11/18 243 lb (110.2 kg)  03/27/18 248 lb (112.5 kg)    Physical Exam Vitals signs and nursing note reviewed.  Constitutional:      General: She is not in acute distress.    Appearance: She is well-developed.  HENT:     Head: Atraumatic.     Right Ear: External ear normal.     Left Ear: External ear normal.     Nose: Nose normal.     Mouth/Throat:     Pharynx: No oropharyngeal exudate.  Eyes:     General: No scleral icterus.     Conjunctiva/sclera: Conjunctivae normal.     Pupils: Pupils are equal, round, and reactive to light.  Neck:     Musculoskeletal: Normal range of motion and neck supple.     Thyroid: No thyromegaly.  Cardiovascular:     Rate and Rhythm: Normal rate and regular rhythm.     Heart sounds: Normal heart sounds.  Pulmonary:     Effort: Pulmonary effort is normal. No respiratory distress.     Breath sounds: Normal breath sounds.  Chest:     Breasts:        Right: No mass, skin change or tenderness.        Left: No mass, skin change or tenderness.  Abdominal:     General: Bowel sounds are normal.     Palpations: Abdomen is soft. There is no mass.     Tenderness: There is no abdominal tenderness.  Musculoskeletal: Normal range of motion.        General: No tenderness.  Lymphadenopathy:     Cervical: No cervical adenopathy.     Upper Body:     Right upper body: No axillary adenopathy.     Left upper body: No axillary adenopathy.  Skin:    General: Skin is warm and dry.     Findings: No rash.  Neurological:     Mental Status: She is alert and oriented to person, place, and time.     Cranial Nerves: No cranial nerve deficit.  Psychiatric:        Behavior: Behavior normal.     Results for orders placed or performed in visit on 06/11/18  TSH  Result Value Ref Range   TSH 3.740 0.450 - 4.500 uIU/mL      Assessment & Plan:   Problem List Items Addressed This Visit      Cardiovascular and Mediastinum   Migraine - Primary    Will start imitrex prn and monitor for improvement. Discussed keeping migraine diary to help identify triggers. If becoming more frequent or not improved by imitrex, may look into preventative medications      Relevant Medications   SUMAtriptan (IMITREX) 50 MG tablet     Endocrine   Acquired hypothyroidism    Recent TSH WNL, continue 50 mcg synthroid       Other Visit Diagnoses    Annual physical exam       Relevant Orders   CBC with  Differential/Platelet   Comprehensive metabolic panel   Lipid Panel w/o Chol/HDL Ratio   UA/M w/rflx Culture, Routine       Follow up plan: Return in about 1 year (around 06/27/2019) for CPE.   LABORATORY TESTING:  - Pap smear: up to date  IMMUNIZATIONS:   - Tdap: Tetanus vaccination status reviewed: last tetanus booster within 10 years. - Influenza: Up to date  PATIENT COUNSELING:   Advised to take 1 mg of folate supplement per day if capable of pregnancy.   Sexuality: Discussed sexually transmitted diseases, partner selection, use of condoms, avoidance of unintended pregnancy  and contraceptive alternatives.   Advised to avoid cigarette smoking.  I discussed with the patient that most people either abstain from alcohol or drink within safe limits (<=14/week and <=4 drinks/occasion for males, <=7/weeks and <= 3 drinks/occasion for females) and that the risk for alcohol disorders and other health effects rises proportionally with the number of drinks per week and how often a drinker exceeds daily limits.  Discussed cessation/primary prevention of drug use and availability of treatment for abuse.   Diet: Encouraged to adjust caloric intake to maintain  or achieve ideal body weight, to reduce intake of dietary saturated fat and total fat, to limit sodium intake by avoiding high sodium foods and not adding  table salt, and to maintain adequate dietary potassium and calcium preferably from fresh fruits, vegetables, and low-fat dairy products.    stressed the importance of regular exercise  Injury prevention: Discussed safety belts, safety helmets, smoke detector, smoking near bedding or upholstery.   Dental health: Discussed importance of regular tooth brushing, flossing, and dental visits.    NEXT PREVENTATIVE PHYSICAL DUE IN 1 YEAR. Return in about 1 year (around 06/27/2019) for CPE.

## 2018-06-27 LAB — CBC WITH DIFFERENTIAL/PLATELET
BASOS: 1 %
Basophils Absolute: 0 10*3/uL (ref 0.0–0.2)
EOS (ABSOLUTE): 0.1 10*3/uL (ref 0.0–0.4)
Eos: 2 %
HEMOGLOBIN: 14 g/dL (ref 11.1–15.9)
Hematocrit: 40.9 % (ref 34.0–46.6)
IMMATURE GRANS (ABS): 0 10*3/uL (ref 0.0–0.1)
IMMATURE GRANULOCYTES: 0 %
LYMPHS: 28 %
Lymphocytes Absolute: 2 10*3/uL (ref 0.7–3.1)
MCH: 28.9 pg (ref 26.6–33.0)
MCHC: 34.2 g/dL (ref 31.5–35.7)
MCV: 85 fL (ref 79–97)
MONOCYTES: 7 %
Monocytes Absolute: 0.5 10*3/uL (ref 0.1–0.9)
NEUTROS ABS: 4.3 10*3/uL (ref 1.4–7.0)
Neutrophils: 62 %
Platelets: 342 10*3/uL (ref 150–450)
RBC: 4.84 x10E6/uL (ref 3.77–5.28)
RDW: 13 % (ref 11.7–15.4)
WBC: 7 10*3/uL (ref 3.4–10.8)

## 2018-06-27 LAB — COMPREHENSIVE METABOLIC PANEL
ALBUMIN: 4.6 g/dL (ref 3.9–5.0)
ALT: 39 IU/L — ABNORMAL HIGH (ref 0–32)
AST: 20 IU/L (ref 0–40)
Albumin/Globulin Ratio: 1.6 (ref 1.2–2.2)
Alkaline Phosphatase: 68 IU/L (ref 39–117)
BILIRUBIN TOTAL: 0.3 mg/dL (ref 0.0–1.2)
BUN / CREAT RATIO: 24 — AB (ref 9–23)
BUN: 14 mg/dL (ref 6–20)
CALCIUM: 9.8 mg/dL (ref 8.7–10.2)
CO2: 23 mmol/L (ref 20–29)
CREATININE: 0.59 mg/dL (ref 0.57–1.00)
Chloride: 101 mmol/L (ref 96–106)
GFR, EST AFRICAN AMERICAN: 146 mL/min/{1.73_m2} (ref >59)
GFR, EST NON AFRICAN AMERICAN: 127 mL/min/{1.73_m2} (ref >59)
GLUCOSE: 102 mg/dL — AB (ref 65–99)
Globulin, Total: 2.9 g/dL (ref 1.5–4.5)
Potassium: 4.5 mmol/L (ref 3.5–5.2)
Sodium: 141 mmol/L (ref 134–144)
TOTAL PROTEIN: 7.5 g/dL (ref 6.0–8.5)

## 2018-06-27 LAB — LIPID PANEL W/O CHOL/HDL RATIO
Cholesterol, Total: 201 mg/dL — ABNORMAL HIGH (ref 100–199)
HDL: 45 mg/dL (ref >39)
LDL CALC: 126 mg/dL — AB (ref 0–99)
Triglycerides: 150 mg/dL — ABNORMAL HIGH (ref 0–149)
VLDL CHOLESTEROL CAL: 30 mg/dL (ref 5–40)

## 2018-07-04 ENCOUNTER — Other Ambulatory Visit: Payer: Self-pay | Admitting: Neurology

## 2018-07-04 DIAGNOSIS — G35 Multiple sclerosis: Secondary | ICD-10-CM

## 2018-07-16 ENCOUNTER — Ambulatory Visit
Admission: RE | Admit: 2018-07-16 | Discharge: 2018-07-16 | Disposition: A | Discharge status: Home / Self Care | Payer: BC Managed Care – PPO | Source: Ambulatory Visit | Attending: Neurology | Admitting: Neurology

## 2018-07-16 DIAGNOSIS — G35 Multiple sclerosis: Secondary | ICD-10-CM | POA: Insufficient documentation

## 2018-08-10 ENCOUNTER — Encounter: Payer: Self-pay | Admitting: Family Medicine

## 2018-08-10 MED ORDER — SUMATRIPTAN SUCCINATE 50 MG PO TABS: ORAL_TABLET | ORAL | 2 refills | Status: DC

## 2018-12-14 ENCOUNTER — Ambulatory Visit (INDEPENDENT_AMBULATORY_CARE_PROVIDER_SITE_OTHER): Payer: BC Managed Care – PPO | Admitting: Family Medicine

## 2018-12-14 ENCOUNTER — Other Ambulatory Visit: Payer: Self-pay | Admitting: Family Medicine

## 2018-12-14 ENCOUNTER — Other Ambulatory Visit: Payer: Self-pay

## 2018-12-14 ENCOUNTER — Encounter: Payer: Self-pay | Admitting: Family Medicine

## 2018-12-14 VITALS — BP 118/82 | HR 90 | Temp 98.3°F | Ht 62.0 in | Wt 233.0 lb

## 2018-12-14 DIAGNOSIS — R42 Dizziness and giddiness: Secondary | ICD-10-CM

## 2018-12-14 DIAGNOSIS — E039 Hypothyroidism, unspecified: Secondary | ICD-10-CM

## 2018-12-14 DIAGNOSIS — E7800 Pure hypercholesterolemia, unspecified: Secondary | ICD-10-CM | POA: Diagnosis not present

## 2018-12-14 MED ORDER — MECLIZINE HCL 25 MG PO TABS: 25.0000 mg | ORAL_TABLET | Freq: Three times a day (TID) | ORAL | 0 refills | Status: DC | PRN

## 2018-12-14 NOTE — Progress Notes (Signed)
BP 118/82   Pulse 90   Temp 98.3 F (36.8 C) (Oral)   Ht 5\' 2"  (1.575 m)   Wt 233 lb (105.7 kg)   SpO2 98%   BMI 42.62 kg/m    Subjective:    Patient ID: Carla [name], female    DOB: 1991/03/15, 28 y.o.   MRN: [id number]  HPI: Carla [name] is a 28 y.o. female  Chief Complaint  Patient presents with  . Dizziness    when bend down and lying down. x about 3 weeks. pt requests blood work for thyroid and cholesterol   Patient presenting today for dizziness when laying down the past 3 weeks. This also happens when a car drives by her on the highway sometimes which has really scared her. This has never happened before to her. Has been doing research and thinks she has vertigo. Has not been trying anything other than rest for sxs. Denies ear pain, headaches, visual changes, fevers, syncope.   Has been eating healthy for the past 5 months and down 15-20 lb. Working with a International aid/development worker which seems to helping her a lot. Wanting her thyroid and cholesterol rechecked now that she's lost weight.   Relevant past medical, surgical, family and social history reviewed and updated as indicated. Interim medical history since our last visit reviewed. Allergies and medications reviewed and updated.  Review of Systems  Per HPI unless specifically indicated above     Objective:    BP 118/82   Pulse 90   Temp 98.3 F (36.8 C) (Oral)   Ht 5\' 2"  (1.575 m)   Wt 233 lb (105.7 kg)   SpO2 98%   BMI 42.62 kg/m   Wt Readings from Last 3 Encounters:  12/14/18 233 lb (105.7 kg)  06/26/18 245 lb 8 oz (111.4 kg)  06/11/18 243 lb (110.2 kg)    Physical Exam Vitals signs and nursing note reviewed.  Constitutional:      Appearance: Normal appearance. She is obese. She is not ill-appearing.  HENT:     Head: Atraumatic.  Eyes:     Extraocular Movements: Extraocular movements intact.     Conjunctiva/sclera: Conjunctivae normal.  Neck:     Musculoskeletal: Normal range of motion and  neck supple.  Cardiovascular:     Rate and Rhythm: Normal rate and regular rhythm.     Heart sounds: Normal heart sounds.  Pulmonary:     Effort: Pulmonary effort is normal.     Breath sounds: Normal breath sounds.  Musculoskeletal: Normal range of motion.  Skin:    General: Skin is warm and dry.  Neurological:     General: No focal deficit present.     Mental Status: She is alert and oriented to person, place, and time.  Psychiatric:        Mood and Affect: Mood normal.        Thought Content: Thought content normal.        Judgment: Judgment normal.     Results for orders placed or performed in visit on 06/26/18  CBC with Differential/Platelet  Result Value Ref Range   WBC 7.0 3.4 - 10.8 x10E3/uL   RBC 4.84 3.77 - 5.28 x10E6/uL   Hemoglobin 14.0 11.1 - 15.9 g/dL   Hematocrit 40.9 34.0 - 46.6 %   MCV 85 79 - 97 fL   MCH 28.9 26.6 - 33.0 pg   MCHC 34.2 31.5 - 35.7 g/dL   RDW 13.0 11.7 - 15.4 %  Platelets 342 150 - 450 x10E3/uL   Neutrophils 62 Not Estab. %   Lymphs 28 Not Estab. %   Monocytes 7 Not Estab. %   Eos 2 Not Estab. %   Basos 1 Not Estab. %   Neutrophils Absolute 4.3 1.4 - 7.0 x10E3/uL   Lymphocytes Absolute 2.0 0.7 - 3.1 x10E3/uL   Monocytes Absolute 0.5 0.1 - 0.9 x10E3/uL   EOS (ABSOLUTE) 0.1 0.0 - 0.4 x10E3/uL   Basophils Absolute 0.0 0.0 - 0.2 x10E3/uL   Immature Granulocytes 0 Not Estab. %   Immature Grans (Abs) 0.0 0.0 - 0.1 x10E3/uL  Comprehensive metabolic panel  Result Value Ref Range   Glucose 102 (H) 65 - 99 mg/dL   BUN 14 6 - 20 mg/dL   Creatinine, Ser 1.88 0.57 - 1.00 mg/dL   GFR calc non Af Amer 127 >59 mL/min/1.73   GFR calc Af Amer 146 >59 mL/min/1.73   BUN/Creatinine Ratio 24 (H) 9 - 23   Sodium 141 134 - 144 mmol/L   Potassium 4.5 3.5 - 5.2 mmol/L   Chloride 101 96 - 106 mmol/L   CO2 23 20 - 29 mmol/L   Calcium 9.8 8.7 - 10.2 mg/dL   Total Protein 7.5 6.0 - 8.5 g/dL   Albumin 4.6 3.9 - 5.0 g/dL   Globulin, Total 2.9 1.5 - 4.5  g/dL   Albumin/Globulin Ratio 1.6 1.2 - 2.2   Bilirubin Total 0.3 0.0 - 1.2 mg/dL   Alkaline Phosphatase 68 39 - 117 IU/L   AST 20 0 - 40 IU/L   ALT 39 (H) 0 - 32 IU/L  Lipid Panel w/o Chol/HDL Ratio  Result Value Ref Range   Cholesterol, Total 201 (H) 100 - 199 mg/dL   Triglycerides 677 (H) 0 - 149 mg/dL   HDL 45 >37 mg/dL   VLDL Cholesterol Cal 30 5 - 40 mg/dL   LDL Calculated 366 (H) 0 - 99 mg/dL  UA/M w/rflx Culture, Routine   Specimen: Urine   URINE  Result Value Ref Range   Specific Gravity, UA 1.025 1.005 - 1.030   pH, UA 6.0 5.0 - 7.5   Color, UA Yellow Yellow   Appearance Ur Clear Clear   Leukocytes, UA Negative Negative   Protein, UA Negative Negative/Trace   Glucose, UA Negative Negative   Ketones, UA Negative Negative   RBC, UA Negative Negative   Bilirubin, UA Negative Negative   Urobilinogen, Ur 0.2 0.2 - 1.0 mg/dL   Nitrite, UA Negative Negative      Assessment & Plan:   Problem List Items Addressed This Visit      Endocrine   Acquired hypothyroidism    Recheck TSH, adjust dose of synthroid prn      Relevant Orders   TSH    Other Visit Diagnoses    Vertigo    -  Primary   Meclizine prn, epley maneuvers, rest. Avoid driving until resolved. Declines ENT or PT referral d/t finances    Elevated cholesterol       Recheck cholesterol, has lost significant weight and made excellent lifestyle changes   Relevant Orders   Lipid Panel w/o Chol/HDL Ratio       Follow up plan: Return in about 6 months (around 06/16/2019) for CPE.

## 2018-12-14 NOTE — Patient Instructions (Signed)

## 2018-12-15 LAB — LIPID PANEL W/O CHOL/HDL RATIO
Cholesterol, Total: 195 mg/dL (ref 100–199)
HDL: 46 mg/dL (ref >39)
LDL Calculated: 108 mg/dL — ABNORMAL HIGH (ref 0–99)
Triglycerides: 205 mg/dL — ABNORMAL HIGH (ref 0–149)
VLDL Cholesterol Cal: 41 mg/dL — ABNORMAL HIGH (ref 5–40)

## 2018-12-15 LAB — TSH: TSH: 2.61 u[IU]/mL (ref 0.450–4.500)

## 2018-12-19 NOTE — Assessment & Plan Note (Signed)
Recheck TSH, adjust dose of synthroid prn

## 2018-12-28 ENCOUNTER — Ambulatory Visit: Payer: Self-pay | Admitting: Family Medicine

## 2019-05-15 ENCOUNTER — Encounter: Payer: Self-pay | Admitting: Family Medicine

## 2019-05-21 ENCOUNTER — Ambulatory Visit (INDEPENDENT_AMBULATORY_CARE_PROVIDER_SITE_OTHER): Payer: BC Managed Care – PPO | Admitting: Family Medicine

## 2019-05-21 ENCOUNTER — Other Ambulatory Visit: Payer: Self-pay

## 2019-05-21 ENCOUNTER — Encounter: Payer: Self-pay | Admitting: Family Medicine

## 2019-05-21 VITALS — Ht 62.0 in | Wt 225.0 lb

## 2019-05-21 DIAGNOSIS — F419 Anxiety disorder, unspecified: Secondary | ICD-10-CM

## 2019-05-21 DIAGNOSIS — O99280 Endocrine, nutritional and metabolic diseases complicating pregnancy, unspecified trimester: Secondary | ICD-10-CM | POA: Diagnosis not present

## 2019-05-21 DIAGNOSIS — G43909 Migraine, unspecified, not intractable, without status migrainosus: Secondary | ICD-10-CM

## 2019-05-21 DIAGNOSIS — R42 Dizziness and giddiness: Secondary | ICD-10-CM | POA: Diagnosis not present

## 2019-05-21 DIAGNOSIS — E039 Hypothyroidism, unspecified: Secondary | ICD-10-CM

## 2019-05-21 MED ORDER — SUMATRIPTAN SUCCINATE 50 MG PO TABS: ORAL_TABLET | ORAL | 2 refills | Status: AC

## 2019-05-21 MED ORDER — LEVOTHYROXINE SODIUM 50 MCG PO TABS: 50.0000 ug | ORAL_TABLET | Freq: Every day | ORAL | 5 refills | Status: DC

## 2019-05-21 MED ORDER — MECLIZINE HCL 25 MG PO TABS: 25.0000 mg | ORAL_TABLET | Freq: Three times a day (TID) | ORAL | 0 refills | Status: DC | PRN

## 2019-05-21 MED ORDER — DULOXETINE HCL 20 MG PO CPEP: 20.0000 mg | ORAL_CAPSULE | Freq: Every day | ORAL | 0 refills | Status: DC

## 2019-05-21 NOTE — Progress Notes (Signed)
Ht 5\' 2"  (1.575 m)   Wt 225 lb (102.1 kg)   BMI 41.15 kg/m    Subjective:    Patient ID: , female    DOB: [birth year], 28 y.o.   MRN: [id number]  HPI: Carla [name] is a 28 y.o. female  Chief Complaint  Patient presents with  . Migraine    pt states she would like to change the sumatriptan for something else    . This visit was completed via WebEx due to the restrictions of the COVID-19 pandemic. All issues as above were discussed and addressed. Physical exam was done as above through visual confirmation on WebEx. If it was felt that the patient should be evaluated in the office, they were directed there. The patient verbally consented to this visit.  .  . Location of the patient: work . Location of the provider: home . Those involved with this call:  . Provider: 34, PA-C . CMA: Roosvelt Maser, CMA . Front Desk/Registration: Elton Sin  . Time spent on call: 15 minutes with patient face to face via video conference. More than 50% of this time was spent in counseling and coordination of care. 5 minutes total spent in review of patient's record and preparation of their chart. I verified patient identity using two factors (patient name and date of birth). Patient consents verbally to being seen via telemedicine visit today.   Migraines worsening, and now having a lot of anxiety and vertigo attacks the past 3 months. Having about 2 migraines per month that last about a week at a time and the vertigo having 1-2 attacks per week. Meclizine helps with the vertigo some but it makes her too sleepy to take during the day. Imitrex helps prn with the migraines when they do come on. Denies visual changes, significant N/V, confusion, syncope. Has not been trying any home exercises for her vertigo.   Depression screen [facility] 2/9 06/26/2018 06/11/2018 08/28/2017  Decreased Interest 0 0 0  Down, Depressed, Hopeless 0 0 0  PHQ - 2 Score 0 0 0  Altered sleeping 1 - -  Tired,  decreased energy 0 - -  Change in appetite 1 - -  Feeling bad or failure about yourself  0 - -  Trouble concentrating 0 - -  Moving slowly or fidgety/restless 0 - -  Suicidal thoughts 0 - -  PHQ-9 Score 2 - -    Relevant past medical, surgical, family and social history reviewed and updated as indicated. Interim medical history since our last visit reviewed. Allergies and medications reviewed and updated.  Review of Systems  Per HPI unless specifically indicated above     Objective:    Ht 5\' 2"  (1.575 m)   Wt 225 lb (102.1 kg)   BMI 41.15 kg/m   Wt Readings from Last 3 Encounters:  05/21/19 225 lb (102.1 kg)  12/14/18 233 lb (105.7 kg)  06/26/18 245 lb 8 oz (111.4 kg)    Physical Exam Vitals and nursing note reviewed.  Constitutional:      General: She is not in acute distress.    Appearance: Normal appearance.  HENT:     Head: Atraumatic.     Right Ear: External ear normal.     Left Ear: External ear normal.     Nose: Nose normal. No congestion.     Mouth/Throat:     Mouth: Mucous membranes are moist.     Pharynx: Oropharynx is clear. No posterior oropharyngeal erythema.  Eyes:     Extraocular Movements: Extraocular movements intact.     Conjunctiva/sclera: Conjunctivae normal.  Cardiovascular:     Comments: Unable to assess via virtual visit Pulmonary:     Effort: Pulmonary effort is normal. No respiratory distress.  Musculoskeletal:        General: Normal range of motion.     Cervical back: Normal range of motion.  Skin:    General: Skin is dry.     Findings: No erythema.  Neurological:     Mental Status: She is alert and oriented to person, place, and time.  Psychiatric:        Mood and Affect: Mood normal.        Thought Content: Thought content normal.        Judgment: Judgment normal.     Results for orders placed or performed in visit on 12/14/18  Lipid Panel w/o Chol/HDL Ratio  Result Value Ref Range   Cholesterol, Total 195 100 - 199 mg/dL     Triglycerides 205 (H) 0 - 149 mg/dL   HDL 46 >39 mg/dL   VLDL Cholesterol Cal 41 (H) 5 - 40 mg/dL   LDL Calculated 108 (H) 0 - 99 mg/dL  TSH  Result Value Ref Range   TSH 2.610 0.450 - 4.500 uIU/mL      Assessment & Plan:   Problem List Items Addressed This Visit      Cardiovascular and Mediastinum   Migraine - Primary    Will start cymbalta for migraine prevention given increased frequency and severity. Continue imitrex prn, will also work on stress and anxiety control.       Relevant Medications   DULoxetine (CYMBALTA) 20 MG capsule   SUMAtriptan (IMITREX) 50 MG tablet    Other Visit Diagnoses    Hypothyroid in pregnancy, antepartum       Relevant Medications   levothyroxine (SYNTHROID) 50 MCG tablet   Vertigo       Continue prn meclizine, start epley maneuvers and home exercises. May need ENT referral if not resolving   Anxiety       Will start cymbalta in hopes of helping with anxiety and migraines. Declines counseling at this time   Relevant Medications   DULoxetine (CYMBALTA) 20 MG capsule       Follow up plan: Return in about 4 weeks (around 06/18/2019) for Anxiety, migraine f/u.

## 2019-05-21 NOTE — Patient Instructions (Signed)

## 2019-05-24 NOTE — Assessment & Plan Note (Signed)
Will start cymbalta for migraine prevention given increased frequency and severity. Continue imitrex prn, will also work on stress and anxiety control.

## 2019-08-01 ENCOUNTER — Ambulatory Visit: Payer: BC Managed Care – PPO | Admitting: Family Medicine

## 2019-08-19 ENCOUNTER — Ambulatory Visit: Payer: BC Managed Care – PPO | Admitting: Family Medicine

## 2019-11-27 ENCOUNTER — Other Ambulatory Visit: Payer: Self-pay

## 2019-11-27 ENCOUNTER — Ambulatory Visit (INDEPENDENT_AMBULATORY_CARE_PROVIDER_SITE_OTHER): Payer: Self-pay | Admitting: Family Medicine

## 2019-11-27 ENCOUNTER — Encounter: Payer: Self-pay | Admitting: Family Medicine

## 2019-11-27 VITALS — BP 122/82 | HR 88 | Temp 98.2°F | Wt 232.0 lb

## 2019-11-27 DIAGNOSIS — O99280 Endocrine, nutritional and metabolic diseases complicating pregnancy, unspecified trimester: Secondary | ICD-10-CM

## 2019-11-27 DIAGNOSIS — H9202 Otalgia, left ear: Secondary | ICD-10-CM

## 2019-11-27 DIAGNOSIS — E039 Hypothyroidism, unspecified: Secondary | ICD-10-CM

## 2019-11-27 MED ORDER — PREDNISONE 50 MG PO TABS: ORAL_TABLET | ORAL | 0 refills | Status: DC

## 2019-11-27 MED ORDER — LEVOTHYROXINE SODIUM 50 MCG PO TABS: 50.0000 ug | ORAL_TABLET | Freq: Every day | ORAL | 3 refills | Status: DC

## 2019-11-27 NOTE — Progress Notes (Signed)
BP 122/82   Pulse 88   Temp 98.2 F (36.8 C) (Oral)   Wt 232 lb (105.2 kg)   SpO2 99%   BMI 42.43 kg/m    Subjective:    Patient ID: Carla [name], female    DOB: [birth year], 29 y.o.   MRN: [id number]  HPI: Carla [name] is a 29 y.o. female  Chief Complaint  Patient presents with  . Ear Pain    left ear x 2 weeks   About 2 weeks of left ear pain that is sharp and stabbing which seems to have followed a sinus infection about a month ago. No muffled hearing, fevers, chills. Does have a hx of seasonal allergies taking claritin prn. Started on amoxil through [facility] a week ago with no relief. Also taking motrin without relief.   Also needing refill on synthroid. Tolerating well, asymptomatic.   Relevant past medical, surgical, family and social history reviewed and updated as indicated. Interim medical history since our last visit reviewed. Allergies and medications reviewed and updated.  Review of Systems  Per HPI unless specifically indicated above     Objective:    BP 122/82   Pulse 88   Temp 98.2 F (36.8 C) (Oral)   Wt 232 lb (105.2 kg)   SpO2 99%   BMI 42.43 kg/m   Wt Readings from Last 3 Encounters:  11/27/19 232 lb (105.2 kg)  05/21/19 225 lb (102.1 kg)  12/14/18 233 lb (105.7 kg)    Physical Exam Vitals and nursing note reviewed.  Constitutional:      Appearance: Normal appearance. She is not ill-appearing.  HENT:     Head: Atraumatic.  Eyes:     Extraocular Movements: Extraocular movements intact.     Conjunctiva/sclera: Conjunctivae normal.  Cardiovascular:     Rate and Rhythm: Normal rate and regular rhythm.     Heart sounds: Normal heart sounds.  Pulmonary:     Effort: Pulmonary effort is normal.     Breath sounds: Normal breath sounds.  Musculoskeletal:        General: Normal range of motion.     Cervical back: Normal range of motion and neck supple.  Skin:    General: Skin is warm and dry.  Neurological:     Mental Status: She is  alert and oriented to person, place, and time.  Psychiatric:        Mood and Affect: Mood normal.        Thought Content: Thought content normal.        Judgment: Judgment normal.     Results for orders placed or performed in visit on 12/14/18  Lipid Panel w/o Chol/HDL Ratio  Result Value Ref Range   Cholesterol, Total 195 100 - 199 mg/dL   Triglycerides 371 (H) 0 - 149 mg/dL   HDL 46 >06 mg/dL   VLDL Cholesterol Cal 41 (H) 5 - 40 mg/dL   LDL Calculated 269 (H) 0 - 99 mg/dL  TSH  Result Value Ref Range   TSH 2.610 0.450 - 4.500 uIU/mL      Assessment & Plan:   Problem List Items Addressed This Visit      Endocrine   Acquired hypothyroidism    Recheck TSH, adjust as needed. Continue current regimen      Relevant Medications   levothyroxine (SYNTHROID) 50 MCG tablet    Other Visit Diagnoses    Left ear pain    -  Primary   Consistent with eustachian  tube dysfunction. Tx with prednisone burst and flonase, claritin daily. F/u if not resolving. May complete course of amoxil   Hypothyroid in pregnancy, antepartum       Relevant Medications   levothyroxine (SYNTHROID) 50 MCG tablet   Other Relevant Orders   TSH       Follow up plan: Return in about 1 year (around 11/26/2020) for Thyroid f/u.

## 2019-11-27 NOTE — Assessment & Plan Note (Signed)
Recheck TSH, adjust as needed. Continue current regimen 

## 2019-11-28 LAB — TSH: TSH: 1.65 u[IU]/mL (ref 0.450–4.500)

## 2020-01-23 ENCOUNTER — Encounter: Payer: Self-pay | Admitting: Family Medicine

## 2020-06-23 ENCOUNTER — Ambulatory Visit: Payer: BC Managed Care – PPO | Admitting: Obstetrics and Gynecology

## 2020-06-24 IMAGING — MR MR HEAD W/O CM
13 series · 44 of 48 positions shown · non-contrast
Comparison: None.

CLINICAL DATA: 27-year-old female with right side head and neck
pain, headaches, numbness in the right face and arm for 1 month.

EXAM:
MRI HEAD WITHOUT CONTRAST
TECHNIQUE: Multiplanar, multiecho pulse sequences of the brain and surrounding
structures were obtained without intravenous contrast.

[Series 5: ax dwi_tracew · axial · 3.0mm · 0.60mm/px · z∈[-69,+82]mm · 3 of 52 slices shown]
[im 1/52]
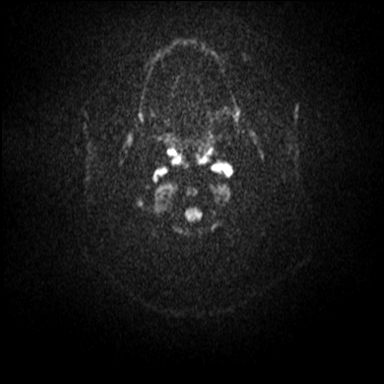
[im 26/52]
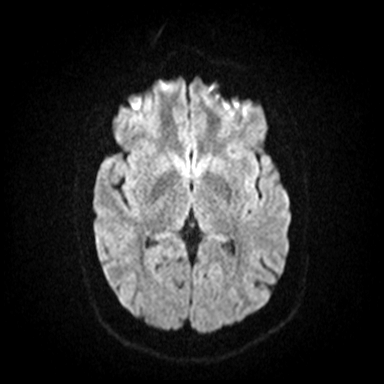
[im 52/52]
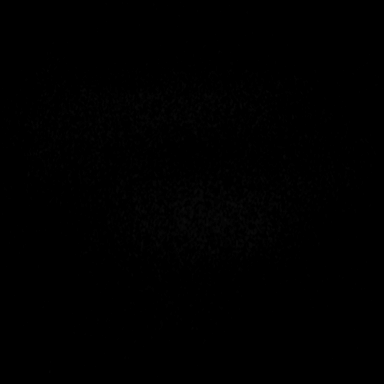

[Series 6: ax dwi_adc · axial · 3.0mm · 0.60mm/px · z∈[-69,+79]mm · 3 of 51 slices shown]
[im 1/51]
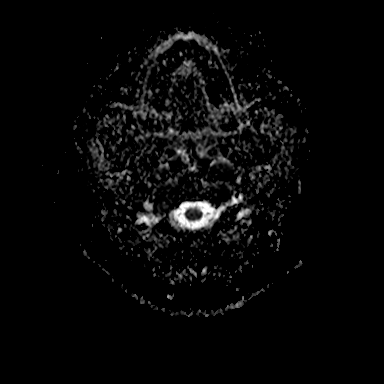
[im 26/51]
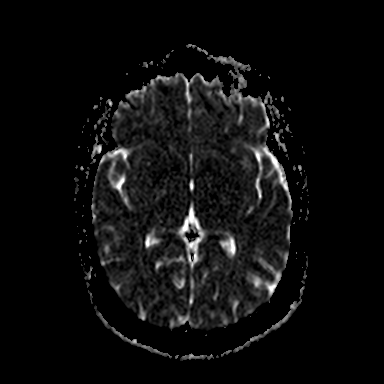
[im 51/51]
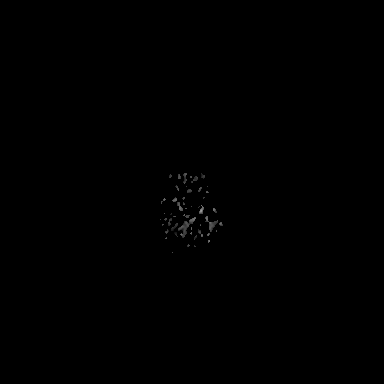

[Series 7: cor dwi_tracew · coronal · 5.0mm · 0.60mm/px · 3 of 38 slices shown]
[im 1/38]
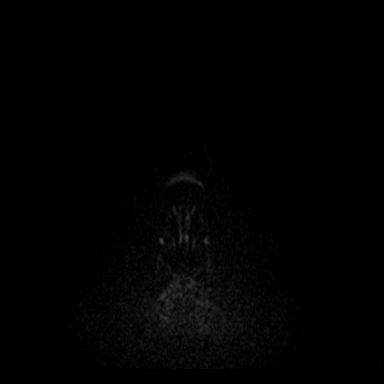
[im 19/38]
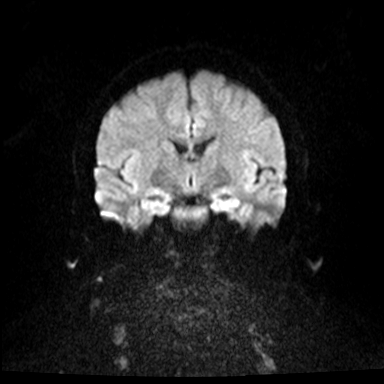
[im 38/38]
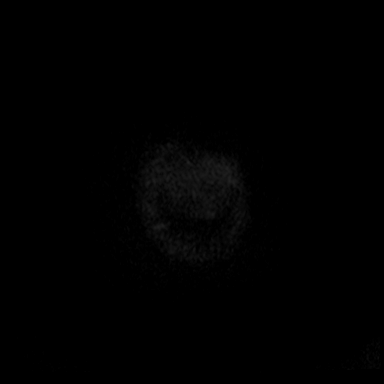

[Series 8: cor dwi_adc · coronal · 5.0mm · 0.60mm/px · 3 of 38 slices shown]
[im 1/38]
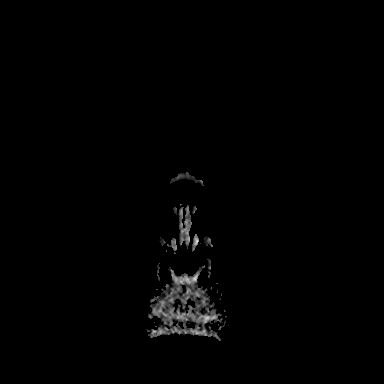
[im 19/38]
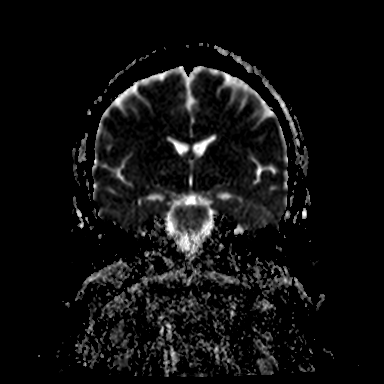
[im 38/38]
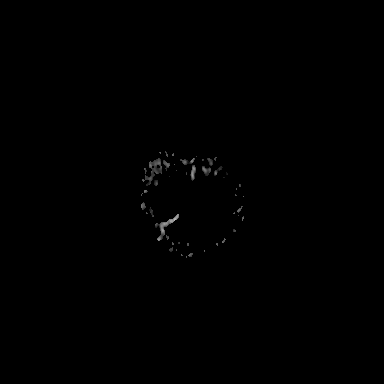

[Series 9: T1 · sagittal · 5.0mm · 0.62mm/px · 2 of 24 slices shown (1 of 2)]
[im 1/24]
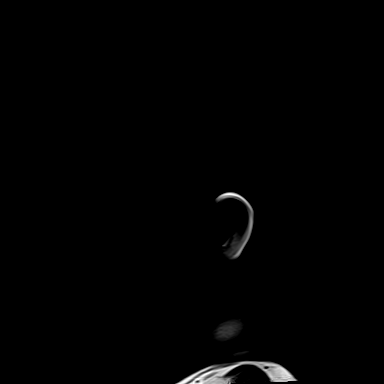
[im 24/24]
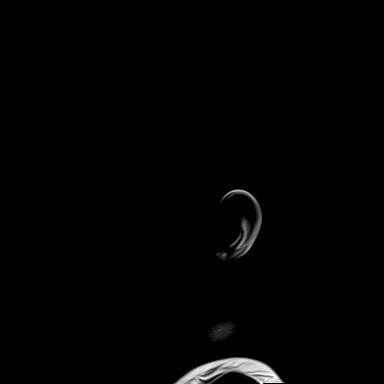

[Series 10: T2 · axial · 5.0mm · 0.53mm/px · z∈[-57,+87]mm · 2 of 25 slices shown (1 of 2)]
[im 1/25]
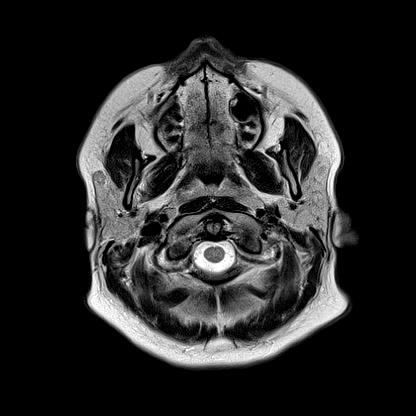
[im 25/25]
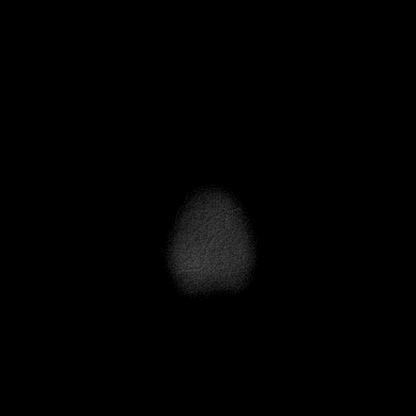

[Series 11: mag_images · axial · 3.0mm · 0.90mm/px · z∈[-74,+102]mm · 4 of 60 slices shown]
[im 1/60]
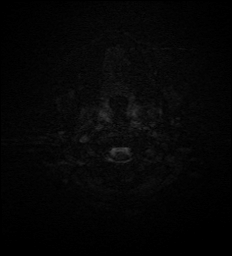
[im 20/60]
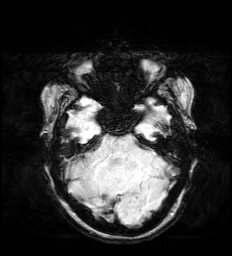
[im 40/60]
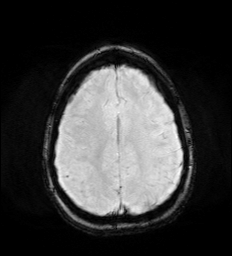
[im 60/60]
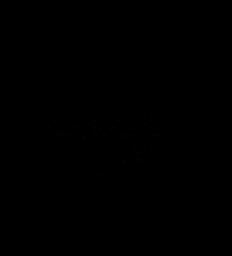

[Series 12: pha_images · axial · 3.0mm · 0.90mm/px · z∈[-74,+102]mm · 4 of 59 slices shown]
[im 1/59]
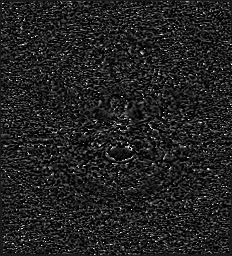
[im 20/59]
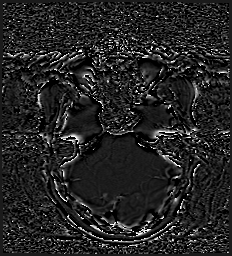
[im 39/59]
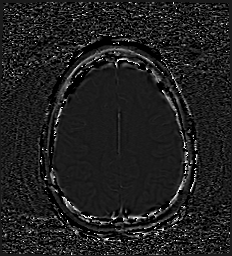
[im 59/59]
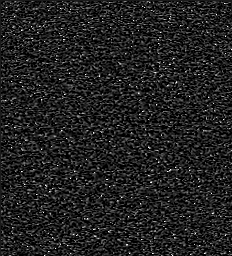

[Series 13: swi_images · axial · 3.0mm · 0.90mm/px · z∈[-74,+102]mm · 4 of 60 slices shown]
[im 1/60]
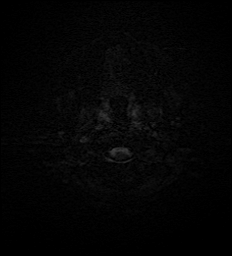
[im 20/60]
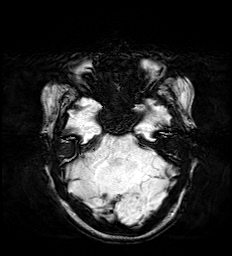
[im 40/60]
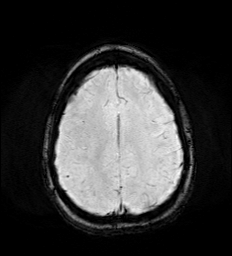
[im 60/60]
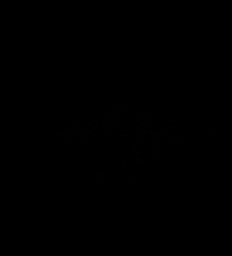

[Series 15: FLAIR · axial · 3.0mm · 0.53mm/px · z∈[-66,+96]mm · 4 of 55 slices shown (1 of 2)]
[im 1/55]
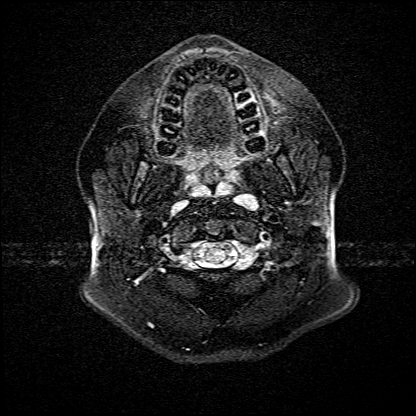
[im 19/55]
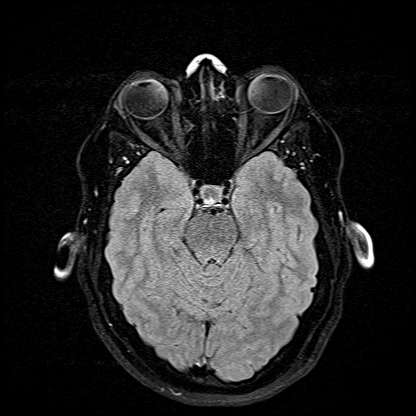
[im 37/55]
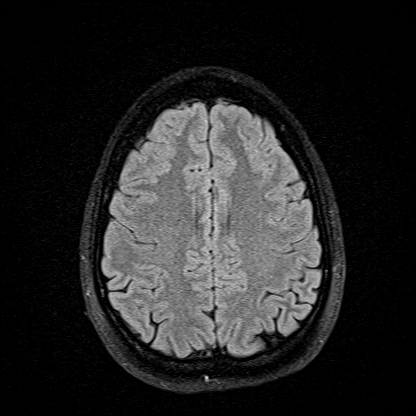
[im 55/55]
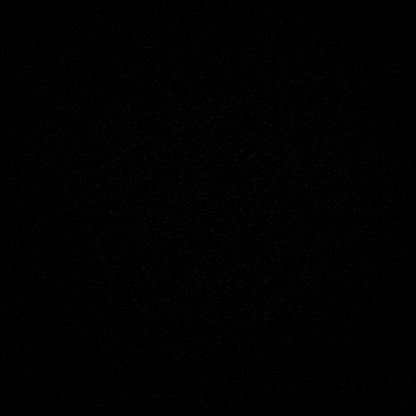

[Series 16: T1 · axial · 1.0mm · 0.98mm/px · z∈[-77,+97]mm · 8 of 174 slices shown (2 of 2)]
[im 1/174]
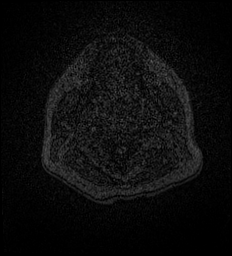
[im 32/174]
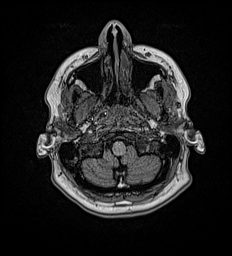
[im 48/174]
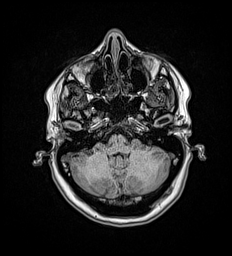
[im 79/174]
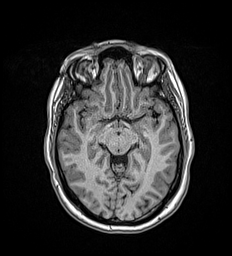
[im 95/174]
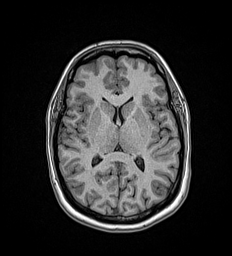
[im 126/174]
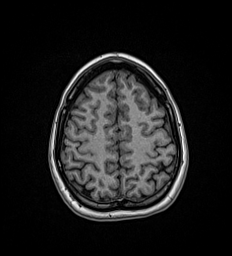
[im 142/174]
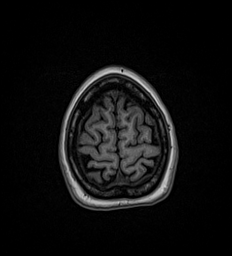
[im 174/174]
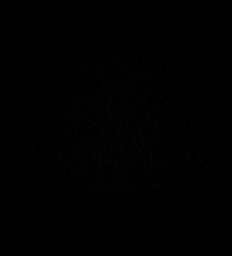

[Series 17: T2 · coronal · 5.0mm · 0.57mm/px · 2 of 29 slices shown (2 of 2)]
[im 1/29]
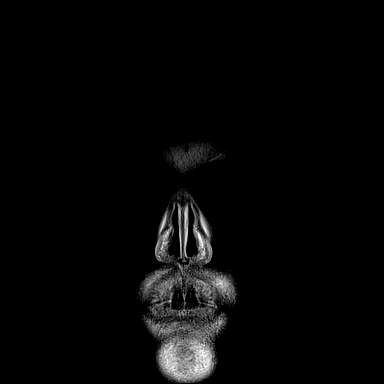
[im 29/29]
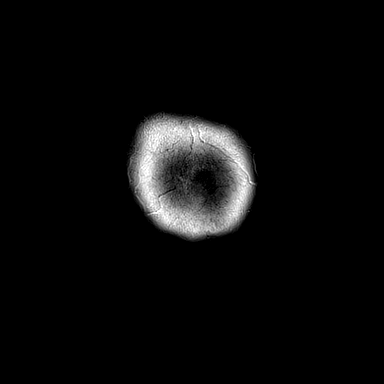

[Series 18: FLAIR · sagittal · 5.0mm · 0.94mm/px · 2 of 25 slices shown (2 of 2)]
[im 1/25]
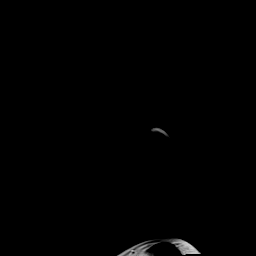
[im 25/25]
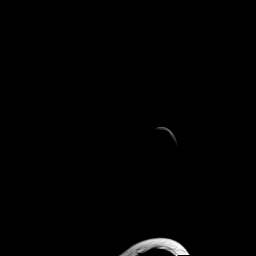

[44 of 48 positions shown; findings below may reference images not displayed]

FINDINGS: Brain: Normal cerebral volume. No restricted diffusion to suggest
acute infarction. No midline shift, mass effect, evidence of mass
lesion, ventriculomegaly, extra-axial collection or acute
intracranial hemorrhage. Cervicomedullary junction and pituitary are
within normal limits.

Gray and white matter signal is within normal limits throughout the
brain. No cerebral white matter lesions. No encephalomalacia or
chronic cerebral blood products. The deep gray matter nuclei,
brainstem, and cerebellum appear normal. No contrast administered.
Normal noncontrast appearance of the cavernous sinus. The visible V3
and infraorbital trigeminal nerve branches appear symmetric and
grossly normal.

Vascular: Major intracranial vascular flow voids are preserved.

Skull and upper cervical spine: Grossly normal visible cervical
spine and spinal cord.

Sinuses/Orbits: Orbits soft tissues are mildly degraded by motion
but appear normal.

Intermittent paranasal sinus opacification and mucosal thickening,
including at the left anterior ethmoid, right posterior ethmoid.

Other: Mastoid air cells are clear. Grossly normal visible internal
auditory structures. Scalp and face soft tissues are within normal
limits.
IMPRESSION: 1. Normal MRI appearance of the brain.
2. Mild paranasal sinus inflammation, significance doubtful.

## 2020-06-26 ENCOUNTER — Ambulatory Visit: Payer: BC Managed Care – PPO | Admitting: Obstetrics and Gynecology

## 2020-07-13 ENCOUNTER — Ambulatory Visit: Payer: Self-pay | Admitting: Obstetrics and Gynecology

## 2020-07-15 ENCOUNTER — Encounter: Payer: Self-pay | Admitting: Obstetrics and Gynecology

## 2020-07-15 ENCOUNTER — Ambulatory Visit: Payer: Self-pay | Admitting: Obstetrics and Gynecology

## 2020-07-15 VITALS — BP 147/87 | Ht 62.0 in | Wt 233.0 lb

## 2020-07-15 DIAGNOSIS — Z30431 Encounter for routine checking of intrauterine contraceptive device: Secondary | ICD-10-CM

## 2020-07-15 DIAGNOSIS — Z6841 Body Mass Index (BMI) 40.0 and over, adult: Secondary | ICD-10-CM

## 2020-07-15 MED ORDER — PHENTERMINE HCL 37.5 MG PO TABS: 37.5000 mg | ORAL_TABLET | Freq: Every day | ORAL | 0 refills | Status: DC

## 2020-07-15 NOTE — Progress Notes (Signed)
Obstetrics & Gynecology Office Visit   Chief Complaint  Patient presents with  . Follow-up    IUD check, wants it checked to make sure its in the right place since she has not had a cycle.    History of Present Illness: 30 y.o. G44P2002 female who had a Mirena IUD placed in 12/2017.  She had spotting in December.  She had spotting for a while day once a week.  Since the placement of her IUD she has not had periods.  She has taken pregnancy tests, which have been negative.  She has no concern regarding STIs.  She has been trying to lose weight since the birth of her 15.72 year-old daughter. She does not check her strings.    She has lost 45 pounds over 1 year.  She now is "stuck" at that point.  She is currently on a keto diet.  She has tried no medicine.  She has tried "all" the diets.   Last pap smear 12/2017: NILM  Past Medical History:  Diagnosis Date  . Allergy   . Anxiety   . Back pain with sciatica    Right  . Gestational diabetes   . Hypothyroidism   . PCOS (polycystic ovarian syndrome)     Past Surgical History:  Procedure Laterality Date  . NO PAST SURGERIES      Gynecologic History: No LMP recorded. (Menstrual status: IUD).  Obstetric History: [id number]  Family History  Problem Relation Age of Onset  . Other Mother        Spinal stenosis  . Hypertension Mother   . Asthma Daughter   . Asthma Son   . Other Maternal Grandmother        Spinal Stenosis  . Cataracts Maternal Grandfather   . Cancer Paternal Grandmother        Breast   . Diabetes Paternal Grandfather     Social History   Socioeconomic History  . Marital status: Married    Spouse name: Not on file  . Number of children: Not on file  . Years of education: Not on file  . Highest education level: Not on file  Occupational History  . Not on file  Tobacco Use  . Smoking status: Never Smoker  . Smokeless tobacco: Never Used  Vaping Use  . Vaping Use: Never used  Substance and Sexual Activity   . Alcohol use: No  . Drug use: No  . Sexual activity: Yes    Birth control/protection: None, I.U.D.  Other Topics Concern  . Not on file  Social History Narrative  . Not on file   Social Determinants of Health   Financial Resource Strain: Not on file  Food Insecurity: Not on file  Transportation Needs: Not on file  Physical Activity: Not on file  Stress: Not on file  Social Connections: Not on file  Intimate Partner Violence: Not on file    Allergies  Allergen Reactions  . Kiwi Extract   . Pineapple   . Shrimp [Shellfish Allergy]     Prior to Admission medications   Medication Sig Start Date End Date Taking? Authorizing Provider  levonorgestrel (MIRENA) 20 MCG/24HR IUD 1 each by Intrauterine route once.   Yes [provider]  levothyroxine (SYNTHROID) 50 MCG tablet Take 1 tablet (50 mcg total) by mouth daily before breakfast. 11/27/19  Yes Particia Nearing, PA-C    Review of Systems  Constitutional: Negative.   HENT: Negative.   Eyes: Negative.   Respiratory: Negative.  Cardiovascular: Negative.   Gastrointestinal: Negative.   Genitourinary: Negative.   Musculoskeletal: Negative.   Skin: Negative.   Neurological: Negative.   Psychiatric/Behavioral: Negative.      Physical Exam BP (!) 147/87   Ht 5\' 2"  (1.575 m)   Wt 233 lb (105.7 kg)   BMI 42.62 kg/m  No LMP recorded. (Menstrual status: IUD).  BP recheck is 138/91 Physical Exam Constitutional:      General: She is not in acute distress.    Appearance: Normal appearance.  HENT:     Head: Normocephalic and atraumatic.  Eyes:     General: No scleral icterus.    Conjunctiva/sclera: Conjunctivae normal.  Neurological:     General: No focal deficit present.     Mental Status: She is alert and oriented to person, place, and time.     Cranial Nerves: No cranial nerve deficit.  Psychiatric:        Mood and Affect: Mood normal.        Behavior: Behavior normal.        Judgment: Judgment  normal.     Female chaperone present for pelvic and breast  portions of the physical exam  Bedside ultrasound (transabdominal): IUD appears to be in correct location within the uterus.   Assessment: 30 y.o. G80P2002 female here for  1. IUD check up   2. Class 3 severe obesity due to excess calories without serious comorbidity with body mass index (BMI) of 40.0 to 44.9 in adult [facility])      Plan: Problem List Items Addressed This Visit      Other   Obesity   Relevant Medications   phentermine (ADIPEX-P) 37.5 MG tablet    Other Visit Diagnoses    IUD check up    -  Primary     Patient reassured about IUD. Offered pregnancy test, she declined. She will check at home, if she is concerned.  Obesity: discussed health weight loss and medications. She does not currently have insurance. Will try Rx for phentermine. Discussed its risks benefits. She knows to stop taking the medication for chest pain, trouble breathing, headache, high blood pressure or any other concerning symptom.  Monitor BP closely.   Return in about 4 weeks (around 08/12/2020) for Medication Follow up.   A total of 25 minutes were spent face-to-face with the patient as well as preparation, review, communication, and documentation during this encounter.    10/12/2020, MD 07/15/2020 3:16 PM

## 2020-08-13 ENCOUNTER — Encounter: Payer: Self-pay | Admitting: Obstetrics and Gynecology

## 2020-08-14 ENCOUNTER — Ambulatory Visit (INDEPENDENT_AMBULATORY_CARE_PROVIDER_SITE_OTHER): Payer: Self-pay | Admitting: Obstetrics and Gynecology

## 2020-08-14 ENCOUNTER — Other Ambulatory Visit: Payer: Self-pay

## 2020-08-14 ENCOUNTER — Encounter: Payer: Self-pay | Admitting: Obstetrics and Gynecology

## 2020-08-14 VITALS — BP 124/86 | Ht 62.0 in | Wt 228.0 lb

## 2020-08-14 DIAGNOSIS — Z6841 Body Mass Index (BMI) 40.0 and over, adult: Secondary | ICD-10-CM

## 2020-08-14 MED ORDER — PHENTERMINE HCL 37.5 MG PO TABS: 37.5000 mg | ORAL_TABLET | Freq: Every day | ORAL | 0 refills | Status: DC

## 2020-08-14 NOTE — Progress Notes (Signed)
Gynecology Visit   Chief Complaint  Patient presents with  . Follow-up  . Weight Check   History of Present Illness: Patientis a 30 y.o. [id number] female, who presents for the evaluation of the desire to lose weight. She has lost 5 pounds (reports having lost 9 lbs on her scale at home). The patient states the following symptoms since starting her weight loss therapy: appetite suppression, energy, and weight loss.  The patient also reports no other ill effects. The patient specifically denies heart palpitations, anxiety, and insomnia.   Review of Systems  Constitutional: Negative.   HENT: Negative.   Eyes: Negative.   Respiratory: Negative.   Cardiovascular: Negative.   Gastrointestinal: Negative.   Genitourinary: Negative.   Musculoskeletal: Negative.   Skin: Negative.   Neurological: Negative.   Psychiatric/Behavioral: Negative.     Past Medical History:  Diagnosis Date  . Allergy   . Anxiety   . Back pain with sciatica    Right  . Gestational diabetes   . Hypothyroidism   . PCOS (polycystic ovarian syndrome)     Past Surgical History:  Procedure Laterality Date  . NO PAST SURGERIES      Gynecologic History: No LMP recorded. (Menstrual status: IUD).  Obstetric History: [id number]  Family History  Problem Relation Age of Onset  . Other Mother        Spinal stenosis  . Hypertension Mother   . Asthma Daughter   . Asthma Son   . Other Maternal Grandmother        Spinal Stenosis  . Cataracts Maternal Grandfather   . Cancer Paternal Grandmother        Breast   . Diabetes Paternal Grandfather     Social History   Socioeconomic History  . Marital status: Married    Spouse name: Not on file  . Number of children: Not on file  . Years of education: Not on file  . Highest education level: Not on file  Occupational History  . Not on file  Tobacco Use  . Smoking status: Never Smoker  . Smokeless tobacco: Never Used  Vaping Use  . Vaping Use: Never used   Substance and Sexual Activity  . Alcohol use: No  . Drug use: No  . Sexual activity: Yes    Birth control/protection: None, I.U.D.  Other Topics Concern  . Not on file  Social History Narrative  . Not on file   Social Determinants of Health   Financial Resource Strain: Not on file  Food Insecurity: Not on file  Transportation Needs: Not on file  Physical Activity: Not on file  Stress: Not on file  Social Connections: Not on file  Intimate Partner Violence: Not on file    Allergies  Allergen Reactions  . Kiwi Extract   . Pineapple   . Shrimp [Shellfish Allergy]     Medications: Prior to Admission medications   Medication Sig Start Date End Date Taking? Authorizing Provider  levonorgestrel (MIRENA) 20 MCG/24HR IUD 1 each by Intrauterine route once.    [provider]  levothyroxine (SYNTHROID) 50 MCG tablet Take 1 tablet (50 mcg total) by mouth daily before breakfast. 11/27/19   Particia Nearing, PA-C  phentermine (ADIPEX-P) 37.5 MG tablet Take 1 tablet (37.5 mg total) by mouth daily before breakfast. 07/15/20   Conard Novak, MD    Physical Exam BP 124/86   Ht 5\' 2"  (1.575 m)   Wt 228 lb (103.4 kg)  BMI 41.70 kg/m   General: NAD HEENT: normocephalic, anicteric Thyroid: no enlargement Pulmonary: no increased work of breathing Neurologic: Grossly intact Psychiatric: mood appropriate, affect full  Assessment: 30 y.o. [id number] here with   ICD-10-CM   1. Class 3 severe obesity due to excess calories without serious comorbidity with body mass index (BMI) of 40.0 to 44.9 in adult (HCC)  E66.01 phentermine (ADIPEX-P) 37.5 MG tablet   Z68.41      Plan: Problem List Items Addressed This Visit      Other   Obesity - Primary   Relevant Medications   phentermine (ADIPEX-P) 37.5 MG tablet      1) 1800 Calorie ADA Diet  2) Patient education given regarding appropriate lifestyle changes for weight loss including: regular physical activity, healthy  coping strategies, caloric restriction and healthy eating patterns.  3) Patient will be started on weight loss medication. The risks and benefits and side effects of medication, such as Adipex (Phenteramine) ,  Tenuate (Diethylproprion), Belviq (lorcarsin), Contrave (buproprion/naltrexone), Qsymia (phentermine/topiramate), and Saxenda (liraglutide) is discussed. The pros and cons of suppressing appetite and boosting metabolism is discussed. Risks of tolerence and addiction is discussed for selected agents discussed. Use of medicine will ne short term, such as 3-4 months at a time followed by a period of time off of the medicine to avoid these risks and side effects for Adipex, Qsymia, and Tenuate discussed. Pt to call with any negative side effects and agrees to keep follow up appts.  4) Patient to take medication, with the benefits of appetite suppression and metabolism boost d/w pt, along with the side effects and risk factors of long term use that will be avoided with our use of short bursts of therapy. Rx provided.    5) 15 minutes face-to-face; with counseling/coordination of care > 50 percent of visit related to obesity and ongoing management/treatment   6) Follow up in 4 weeks to assess response  [name], MD 08/14/2020 8:23 AM

## 2020-08-18 ENCOUNTER — Ambulatory Visit: Payer: Self-pay | Admitting: Obstetrics and Gynecology

## 2020-09-11 ENCOUNTER — Other Ambulatory Visit: Payer: Self-pay

## 2020-09-11 ENCOUNTER — Ambulatory Visit (INDEPENDENT_AMBULATORY_CARE_PROVIDER_SITE_OTHER): Payer: Self-pay | Admitting: Obstetrics and Gynecology

## 2020-09-11 ENCOUNTER — Encounter: Payer: Self-pay | Admitting: Obstetrics and Gynecology

## 2020-09-11 DIAGNOSIS — Z6841 Body Mass Index (BMI) 40.0 and over, adult: Secondary | ICD-10-CM

## 2020-09-11 MED ORDER — PHENTERMINE HCL 37.5 MG PO TABS: 37.5000 mg | ORAL_TABLET | Freq: Every day | ORAL | 0 refills | Status: DC

## 2020-09-11 NOTE — Progress Notes (Signed)
Gynecology Visit   Chief Complaint  Patient presents with  . Follow-up   History of Present Illness: Patientis a 30 y.o. G42P2002 female, who presents for the evaluation of the desire to lose weight. She has lost a total of 9 pounds (4 since her last visit) (reports having lost a total of 16 lbs on her scale at home). The patient states the following symptoms since starting her weight loss therapy: appetite suppression, energy, and weight loss.  The patient also reports no other ill effects. The patient specifically denies heart palpitations, anxiety, and insomnia.   Review of Systems  Constitutional: Negative.   HENT: Negative.   Eyes: Negative.   Respiratory: Negative.   Cardiovascular: Negative.   Gastrointestinal: Negative.   Genitourinary: Negative.   Musculoskeletal: Negative.   Skin: Negative.   Neurological: Negative.   Psychiatric/Behavioral: Negative.     Past Medical History:  Diagnosis Date  . Allergy   . Anxiety   . Back pain with sciatica    Right  . Gestational diabetes   . Hypothyroidism   . PCOS (polycystic ovarian syndrome)     Past Surgical History:  Procedure Laterality Date  . NO PAST SURGERIES      Gynecologic History: No LMP recorded. (Menstrual status: IUD).  Obstetric History: [id number]  Family History  Problem Relation Age of Onset  . Other Mother        Spinal stenosis  . Hypertension Mother   . Asthma Daughter   . Asthma Son   . Other Maternal Grandmother        Spinal Stenosis  . Cataracts Maternal Grandfather   . Cancer Paternal Grandmother        Breast   . Diabetes Paternal Grandfather     Social History   Socioeconomic History  . Marital status: Married    Spouse name: Not on file  . Number of children: Not on file  . Years of education: Not on file  . Highest education level: Not on file  Occupational History  . Not on file  Tobacco Use  . Smoking status: Never Smoker  . Smokeless tobacco: Never Used  Vaping  Use  . Vaping Use: Never used  Substance and Sexual Activity  . Alcohol use: No  . Drug use: No  . Sexual activity: Yes    Birth control/protection: None, I.U.D.  Other Topics Concern  . Not on file  Social History Narrative  . Not on file   Social Determinants of Health   Financial Resource Strain: Not on file  Food Insecurity: Not on file  Transportation Needs: Not on file  Physical Activity: Not on file  Stress: Not on file  Social Connections: Not on file  Intimate Partner Violence: Not on file    Allergies  Allergen Reactions  . Kiwi Extract   . Pineapple   . Shrimp [Shellfish Allergy]     Medications: Prior to Admission medications   Medication Sig Start Date End Date Taking? Authorizing Provider  levonorgestrel (MIRENA) 20 MCG/24HR IUD 1 each by Intrauterine route once.    [provider]  levothyroxine (SYNTHROID) 50 MCG tablet Take 1 tablet (50 mcg total) by mouth daily before breakfast. 11/27/19   Particia Nearing, PA-C  phentermine (ADIPEX-P) 37.5 MG tablet Take 1 tablet (37.5 mg total) by mouth daily before breakfast. 07/15/20   Conard Novak, MD    Physical Exam BP 122/74   Ht 5\' 2"  (1.575 m)  Wt 224 lb (101.6 kg)   BMI 40.97 kg/m   General: NAD HEENT: normocephalic, anicteric Thyroid: no enlargement Pulmonary: no increased work of breathing Neurologic: Grossly intact Psychiatric: mood appropriate, affect full  Assessment: 30 y.o. S0Y3016 here with   ICD-10-CM   1. Class 3 severe obesity due to excess calories without serious comorbidity with body mass index (BMI) of 40.0 to 44.9 in adult (HCC)  E66.01 phentermine (ADIPEX-P) 37.5 MG tablet   Z68.41      Plan: Problem List Items Addressed This Visit      Other   Obesity - Primary   Relevant Medications   phentermine (ADIPEX-P) 37.5 MG tablet      1) 1800 Calorie ADA Diet  2) Patient education given regarding appropriate lifestyle changes for weight loss including:  regular physical activity, healthy coping strategies, caloric restriction and healthy eating patterns.  3) Patient will be started on weight loss medication. The risks and benefits and side effects of medication, such as Adipex (Phenteramine) ,  Tenuate (Diethylproprion), Belviq (lorcarsin), Contrave (buproprion/naltrexone), Qsymia (phentermine/topiramate), and Saxenda (liraglutide) is discussed. The pros and cons of suppressing appetite and boosting metabolism is discussed. Risks of tolerence and addiction is discussed for selected agents discussed. Use of medicine will ne short term, such as 3-4 months at a time followed by a period of time off of the medicine to avoid these risks and side effects for Adipex, Qsymia, and Tenuate discussed. Pt to call with any negative side effects and agrees to keep follow up appts.  4) Patient to take medication, with the benefits of appetite suppression and metabolism boost d/w pt, along with the side effects and risk factors of long term use that will be avoided with our use of short bursts of therapy. Rx provided.    5) Will not follow weight loss response. Consider another course in several months, if desired.   A total of 22 minutes were spent face-to-face with the patient as well as preparation, review, communication, and documentation during this encounter.    [name], MD 09/11/2020 8:26 AM

## 2020-12-02 ENCOUNTER — Ambulatory Visit: Payer: Self-pay | Admitting: Nurse Practitioner

## 2020-12-14 ENCOUNTER — Ambulatory Visit: Payer: Self-pay | Admitting: Obstetrics and Gynecology

## 2021-01-05 ENCOUNTER — Other Ambulatory Visit (HOSPITAL_COMMUNITY)
Admission: RE | Admit: 2021-01-05 | Discharge: 2021-01-05 | Disposition: A | Discharge status: Home / Self Care | Payer: BC Managed Care – PPO | Source: Ambulatory Visit | Attending: Obstetrics and Gynecology | Admitting: Obstetrics and Gynecology

## 2021-01-05 ENCOUNTER — Other Ambulatory Visit: Payer: Self-pay

## 2021-01-05 ENCOUNTER — Encounter: Payer: Self-pay | Admitting: Obstetrics and Gynecology

## 2021-01-05 ENCOUNTER — Ambulatory Visit (INDEPENDENT_AMBULATORY_CARE_PROVIDER_SITE_OTHER): Payer: Self-pay | Admitting: Obstetrics and Gynecology

## 2021-01-05 VITALS — BP 120/76 | HR 72 | Ht 62.0 in | Wt 226.0 lb

## 2021-01-05 DIAGNOSIS — Z01419 Encounter for gynecological examination (general) (routine) without abnormal findings: Secondary | ICD-10-CM | POA: Insufficient documentation

## 2021-01-05 DIAGNOSIS — E039 Hypothyroidism, unspecified: Secondary | ICD-10-CM

## 2021-01-05 DIAGNOSIS — Z124 Encounter for screening for malignant neoplasm of cervix: Secondary | ICD-10-CM | POA: Insufficient documentation

## 2021-01-05 DIAGNOSIS — Z6841 Body Mass Index (BMI) 40.0 and over, adult: Secondary | ICD-10-CM

## 2021-01-05 DIAGNOSIS — Z1331 Encounter for screening for depression: Secondary | ICD-10-CM

## 2021-01-05 DIAGNOSIS — Z1339 Encounter for screening examination for other mental health and behavioral disorders: Secondary | ICD-10-CM

## 2021-01-05 MED ORDER — PHENTERMINE HCL 37.5 MG PO CAPS: 37.5000 mg | ORAL_CAPSULE | ORAL | 0 refills | Status: DC

## 2021-01-05 NOTE — Progress Notes (Signed)
Gynecology Annual Exam  PCP: Larae Grooms, NP  Chief Complaint  Patient presents with   Annual Exam   History of Present Illness:  [name] is a 30 y.o. [phone] who LMP was No LMP recorded. (Menstrual status: IUD)., presents today for her annual examination.  Her menses are absent with her IUD.   She is sexually active.  She is using an IUD for contraception. Denies pain with intercourse.  Last Pap: 12/2017  Results were: no abnormalities /neg HPV DNA not done due to age Hx of STDs: none  There is a FH of breast cancer in her paternal grandmother at age 41. There is no FH of ovarian cancer. The patient does not do self-breast exams.  Tobacco use: The patient denies current or previous tobacco use. Alcohol use: social drinker Exercise: walks with the kids  The patient wears seatbelts: yes.   The patient reports that domestic violence in her life is absent.   Her weight is similar to last visit.  She believes that she lost 0.5 pounds.  She would like to try phentermine today.   Past Medical History:  Diagnosis Date   Allergy    Anxiety    Back pain with sciatica    Right   Gestational diabetes    Hypothyroidism    PCOS (polycystic ovarian syndrome)     Past Surgical History:  Procedure Laterality Date   NO PAST SURGERIES      Prior to Admission medications   Medication Sig Start Date End Date Taking? Authorizing Provider  levonorgestrel (MIRENA) 20 MCG/24HR IUD 1 each by Intrauterine route once.   Yes [provider]  levothyroxine (SYNTHROID) 50 MCG tablet Take 1 tablet (50 mcg total) by mouth daily before breakfast. 11/27/19  Yes Particia Nearing, PA-C    Allergies  Allergen Reactions   Kiwi Extract    Pineapple    Shrimp [Shellfish Allergy]     Obstetric History: [id number]  Social History   Socioeconomic History   Marital status: Married    Spouse name: Not on file   Number of children: Not on file   Years of education: Not on  file   Highest education level: Not on file  Occupational History   Not on file  Tobacco Use   Smoking status: Never   Smokeless tobacco: Never  Vaping Use   Vaping Use: Never used  Substance and Sexual Activity   Alcohol use: No   Drug use: No   Sexual activity: Yes    Birth control/protection: I.U.D.  Other Topics Concern   Not on file  Social History Narrative   Not on file   Social Determinants of Health   Financial Resource Strain: Not on file  Food Insecurity: Not on file  Transportation Needs: Not on file  Physical Activity: Not on file  Stress: Not on file  Social Connections: Not on file  Intimate Partner Violence: Not on file    Family History  Problem Relation Age of Onset   Other Mother        Spinal stenosis   Hypertension Mother    Other Maternal Grandmother        Spinal Stenosis   Cataracts Maternal Grandfather    Breast cancer Paternal Grandmother 37       deceased at 85 Stage 4   Cancer Paternal Grandmother        Breast    Diabetes Paternal Grandfather    Asthma Daughter  Asthma Son     Review of Systems  Constitutional: Negative.   HENT: Negative.    Eyes: Negative.   Respiratory: Negative.    Cardiovascular: Negative.   Gastrointestinal: Negative.   Genitourinary: Negative.   Musculoskeletal: Negative.   Skin: Negative.   Neurological: Negative.   Psychiatric/Behavioral: Negative.      Physical Exam BP 120/76 (Cuff Size: Large)   Pulse 72   Ht 5\' 2"  (1.575 m)   Wt 226 lb (102.5 kg)   BMI 41.34 kg/m    Physical Exam Constitutional:      General: She is not in acute distress.    Appearance: Normal appearance. She is well-developed.  Genitourinary:     Vulva and bladder normal.     Right Labia: No rash, tenderness, lesions, skin changes or Bartholin's cyst.    Left Labia: No tenderness, lesions, skin changes, Bartholin's cyst or rash.    No inguinal adenopathy present in the right or left side.    Pelvic Tanner Score:  5/5.    No vaginal discharge, erythema, tenderness or bleeding.      Right Adnexa: not tender, not full and no mass present.    Left Adnexa: not tender, not full and no mass present.    No cervical motion tenderness, discharge, lesion or polyp.     IUD strings visualized.     Uterus is not enlarged or tender.     No uterine mass detected.    Pelvic exam was performed with patient in the lithotomy position.  Breasts:    Right: No inverted nipple, mass, nipple discharge, skin change or tenderness.     Left: No inverted nipple, mass, nipple discharge, skin change or tenderness.  HENT:     Head: Normocephalic and atraumatic.  Eyes:     General: No scleral icterus.    Conjunctiva/sclera: Conjunctivae normal.  Neck:     Thyroid: No thyromegaly.  Cardiovascular:     Rate and Rhythm: Normal rate and regular rhythm.     Heart sounds: No murmur heard.   No friction rub. No gallop.  Pulmonary:     Effort: Pulmonary effort is normal. No respiratory distress.     Breath sounds: Normal breath sounds. No wheezing or rales.  Abdominal:     General: Bowel sounds are normal. There is no distension.     Palpations: Abdomen is soft. There is no mass.     Tenderness: There is no abdominal tenderness. There is no guarding or rebound.     Hernia: There is no hernia in the left inguinal area or right inguinal area.  Musculoskeletal:        General: No swelling or tenderness. Normal range of motion.     Cervical back: Normal range of motion and neck supple.  Lymphadenopathy:     Cervical: No cervical adenopathy.     Lower Body: No right inguinal adenopathy. No left inguinal adenopathy.  Neurological:     General: No focal deficit present.     Mental Status: She is alert and oriented to person, place, and time.     Cranial Nerves: No cranial nerve deficit.  Skin:    General: Skin is warm and dry.     Findings: No erythema or rash.  Psychiatric:        Mood and Affect: Mood normal.         Behavior: Behavior normal.        Judgment: Judgment normal.    Female chaperone present  for pelvic and breast  portions of the physical exam  Results: AUDIT Questionnaire (screen for alcoholism): 1 PHQ-9: 2   Assessment: 29 y.o. G23P2002 female here for routine annual gynecologic examination  Plan: Problem List Items Addressed This Visit       Endocrine   Acquired hypothyroidism   Relevant Orders   TSH     Other   BMI 40.0-44.9, adult (HCC)   Relevant Medications   phentermine 37.5 MG capsule   Obesity   Relevant Medications   phentermine 37.5 MG capsule   Other Visit Diagnoses     Women's annual routine gynecological examination    -  Primary   Relevant Orders   Cytology - PAP   Screening for depression       Screening for alcoholism       Pap smear for cervical cancer screening       Relevant Orders   Cytology - PAP       Screening: -- Blood pressure screen normal -- Weight screening: obese: discussed management options, including lifestyle, dietary, and exercise. -- Depression screening negative (PHQ-9) -- Nutrition: normal -- cholesterol screening: not due for screening -- osteoporosis screening: not due -- tobacco screening: not using -- alcohol screening: AUDIT questionnaire indicates low-risk usage. -- family history of breast cancer screening: done. not at high risk. -- no evidence of domestic violence or intimate partner violence. -- STD screening: gonorrhea/chlamydia NAAT not collected per patient request. -- pap smear collected per [facility] guidelines     [name], MD 01/05/2021 3:10 PM

## 2021-01-07 LAB — CYTOLOGY - PAP: Diagnosis: NEGATIVE

## 2021-01-13 LAB — TSH: TSH: 1.22 u[IU]/mL (ref 0.450–4.500)

## 2021-02-03 ENCOUNTER — Ambulatory Visit: Payer: Self-pay | Admitting: Obstetrics and Gynecology

## 2021-02-05 ENCOUNTER — Other Ambulatory Visit: Payer: Self-pay

## 2021-02-05 ENCOUNTER — Encounter: Payer: Self-pay | Admitting: Obstetrics and Gynecology

## 2021-02-05 ENCOUNTER — Ambulatory Visit (INDEPENDENT_AMBULATORY_CARE_PROVIDER_SITE_OTHER): Payer: Self-pay | Admitting: Obstetrics and Gynecology

## 2021-02-05 ENCOUNTER — Ambulatory Visit: Payer: Self-pay | Admitting: Obstetrics and Gynecology

## 2021-02-05 VITALS — BP 110/60 | Ht 62.0 in | Wt 224.0 lb

## 2021-02-05 DIAGNOSIS — Z6841 Body Mass Index (BMI) 40.0 and over, adult: Secondary | ICD-10-CM

## 2021-02-05 MED ORDER — PHENTERMINE HCL 37.5 MG PO CAPS: 37.5000 mg | ORAL_CAPSULE | ORAL | 0 refills | Status: DC

## 2021-02-05 NOTE — Progress Notes (Signed)
Obstetrics & Gynecology Office Visit    Chief Complaint  Patient presents with   Follow-up    Medication    History of Present Illness: 30 y.o. G63P2002 female who presents for a weight loss follow up.   Over the past month she has lost several pounds.  She states that while the scale does not reflect much weight loss, she has noticed that she is wearing clothes that are smaller.  She states that she has been going to the gym and believes that if she has not had much weight loss that is because she has had some muscle gain.  She has had no side effects from the medication apart from weight loss.  She would like to continue with the medication.  This upcoming month would be a month #2.   Past Medical History:  Diagnosis Date   Allergy    Anxiety    Back pain with sciatica    Right   Gestational diabetes    Hypothyroidism    PCOS (polycystic ovarian syndrome)     Past Surgical History:  Procedure Laterality Date   NO PAST SURGERIES      Gynecologic History: No LMP recorded. (Menstrual status: IUD).  Obstetric History: [id number]  Family History  Problem Relation Age of Onset   Other Mother        Spinal stenosis   Hypertension Mother    Other Maternal Grandmother        Spinal Stenosis   Cataracts Maternal Grandfather    Breast cancer Paternal Grandmother 73       deceased at 65 Stage 4   Cancer Paternal Grandmother        Breast    Diabetes Paternal Grandfather    Asthma Daughter    Asthma Son     Social History   Socioeconomic History   Marital status: Married    Spouse name: Not on file   Number of children: Not on file   Years of education: Not on file   Highest education level: Not on file  Occupational History   Not on file  Tobacco Use   Smoking status: Never   Smokeless tobacco: Never  Vaping Use   Vaping Use: Never used  Substance and Sexual Activity   Alcohol use: No   Drug use: No   Sexual activity: Yes    Birth control/protection: I.U.D.   Other Topics Concern   Not on file  Social History Narrative   Not on file   Social Determinants of Health   Financial Resource Strain: Not on file  Food Insecurity: Not on file  Transportation Needs: Not on file  Physical Activity: Not on file  Stress: Not on file  Social Connections: Not on file  Intimate Partner Violence: Not on file    Allergies  Allergen Reactions   Kiwi Extract    Pineapple    Shrimp [Shellfish Allergy]     Prior to Admission medications   Medication Sig Start Date End Date Taking? Authorizing Provider  levonorgestrel (MIRENA) 20 MCG/24HR IUD 1 each by Intrauterine route once.   Yes [provider]  levothyroxine (SYNTHROID) 50 MCG tablet Take 1 tablet (50 mcg total) by mouth daily before breakfast. 11/27/19  Yes Particia Nearing, PA-C  phentermine 37.5 MG capsule Take 1 capsule (37.5 mg total) by mouth every morning. 01/05/21  Yes Conard Novak, MD  SUMAtriptan (IMITREX) 50 MG tablet Take 1 tab at first sign of migraine. May repeat in 2  hours if headache persists or recurs. 05/21/19  Yes Particia Nearing, PA-C    Review of Systems  Constitutional: Negative.   HENT: Negative.    Eyes: Negative.   Respiratory: Negative.    Cardiovascular: Negative.   Gastrointestinal: Negative.   Genitourinary: Negative.   Musculoskeletal: Negative.   Skin: Negative.   Neurological: Negative.   Psychiatric/Behavioral: Negative.      Physical Exam BP 110/60   Ht 5\' 2"  (1.575 m)   Wt 224 lb (101.6 kg)   BMI 40.97 kg/m  No LMP recorded. (Menstrual status: IUD). Physical Exam Constitutional:      General: She is not in acute distress.    Appearance: Normal appearance.  HENT:     Head: Normocephalic and atraumatic.  Eyes:     General: No scleral icterus.    Conjunctiva/sclera: Conjunctivae normal.  Neurological:     General: No focal deficit present.     Mental Status: She is alert and oriented to person, place, and time.      Cranial Nerves: No cranial nerve deficit.  Psychiatric:        Mood and Affect: Mood normal.        Behavior: Behavior normal.        Judgment: Judgment normal.    Female chaperone present for pelvic and breast  portions of the physical exam  Assessment: 30 y.o. G32P2002 female here for  1. BMI 40.0-44.9, adult (HCC)   2. Class 3 severe obesity due to excess calories without serious comorbidity with body mass index (BMI) of 40.0 to 44.9 in adult [facility])      Plan: Problem List Items Addressed This Visit       Other   BMI 40.0-44.9, adult (HCC) - Primary   Relevant Medications   phentermine 37.5 MG capsule   Obesity   Relevant Medications   phentermine 37.5 MG capsule   Will continue with the weight loss medication for another month.  She understands that if she has no weight loss this month, we will have to consider another medication.  If she does realize weight loss this month, we are not able to continue the medication for an additional month for a total of 3 months.  After 3 months of this medication a break must be taken and she understands all questions answered.  A total of 24 minutes were spent face-to-face with the patient as well as preparation, review, communication, and documentation during this encounter.    03-19-1981, MD 02/05/2021 5:56 PM

## 2021-02-09 ENCOUNTER — Encounter: Payer: Self-pay | Admitting: Obstetrics and Gynecology

## 2021-03-08 ENCOUNTER — Ambulatory Visit (INDEPENDENT_AMBULATORY_CARE_PROVIDER_SITE_OTHER): Payer: Self-pay | Admitting: Obstetrics and Gynecology

## 2021-03-08 ENCOUNTER — Other Ambulatory Visit: Payer: Self-pay

## 2021-03-08 ENCOUNTER — Encounter: Payer: Self-pay | Admitting: Obstetrics and Gynecology

## 2021-03-08 VITALS — BP 122/70 | Ht 62.0 in | Wt 225.0 lb

## 2021-03-08 DIAGNOSIS — Z6841 Body Mass Index (BMI) 40.0 and over, adult: Secondary | ICD-10-CM

## 2021-03-08 MED ORDER — PHENTERMINE HCL 37.5 MG PO CAPS: 37.5000 mg | ORAL_CAPSULE | ORAL | 0 refills | Status: AC

## 2021-03-08 NOTE — Progress Notes (Signed)
Obstetrics & Gynecology Office Visit    Chief Complaint  Patient presents with   Follow-up   Weight Check    History of Present Illness: 30 y.o. G36P2002 female who presents for a weight loss follow up.   Over the past month she has lost several pounds.  She states that while the scale does not reflect much weight loss, she has noticed that she is wearing clothes that are smaller.  This continues to be the situation. She notes that she feels smaller and drinks water instead of eating.  She states that she has been going to the gym and believes that if she has not had much weight loss that is because she has had some muscle gain.  She has had no side effects from the medication apart from weight loss.  She would like to continue with the medication.  This upcoming month would be a month #3.   Past Medical History:  Diagnosis Date   Allergy    Anxiety    Back pain with sciatica    Right   Gestational diabetes    Hypothyroidism    PCOS (polycystic ovarian syndrome)     Past Surgical History:  Procedure Laterality Date   NO PAST SURGERIES      Gynecologic History: No LMP recorded. (Menstrual status: IUD).  Obstetric History: [id number]  Family History  Problem Relation Age of Onset   Other Mother        Spinal stenosis   Hypertension Mother    Other Maternal Grandmother        Spinal Stenosis   Cataracts Maternal Grandfather    Breast cancer Paternal Grandmother 22       deceased at 16 Stage 4   Cancer Paternal Grandmother        Breast    Diabetes Paternal Grandfather    Asthma Daughter    Asthma Son     Social History   Socioeconomic History   Marital status: Married    Spouse name: Not on file   Number of children: Not on file   Years of education: Not on file   Highest education level: Not on file  Occupational History   Not on file  Tobacco Use   Smoking status: Never   Smokeless tobacco: Never  Vaping Use   Vaping Use: Never used  Substance and Sexual  Activity   Alcohol use: No   Drug use: No   Sexual activity: Yes    Birth control/protection: I.U.D.  Other Topics Concern   Not on file  Social History Narrative   Not on file   Social Determinants of Health   Financial Resource Strain: Not on file  Food Insecurity: Not on file  Transportation Needs: Not on file  Physical Activity: Not on file  Stress: Not on file  Social Connections: Not on file  Intimate Partner Violence: Not on file    Allergies  Allergen Reactions   Kiwi Extract    Pineapple    Shrimp [Shellfish Allergy]     Prior to Admission medications   Medication Sig Start Date End Date Taking? Authorizing Provider  levonorgestrel (MIRENA) 20 MCG/24HR IUD 1 each by Intrauterine route once.   Yes [provider]  levothyroxine (SYNTHROID) 50 MCG tablet Take 1 tablet (50 mcg total) by mouth daily before breakfast. 11/27/19  Yes Particia Nearing, PA-C  phentermine 37.5 MG capsule Take 1 capsule (37.5 mg total) by mouth every morning. 01/05/21  Yes Conard Novak,  MD  SUMAtriptan (IMITREX) 50 MG tablet Take 1 tab at first sign of migraine. May repeat in 2 hours if headache persists or recurs. 05/21/19  Yes Particia Nearing, PA-C    Review of Systems  Constitutional: Negative.   HENT: Negative.    Eyes: Negative.   Respiratory: Negative.    Cardiovascular: Negative.   Gastrointestinal: Negative.   Genitourinary: Negative.   Musculoskeletal: Negative.   Skin: Negative.   Neurological: Negative.   Psychiatric/Behavioral: Negative.      Physical Exam BP 122/70   Ht 5\' 2"  (1.575 m)   Wt 225 lb (102.1 kg)   BMI 41.15 kg/m  No LMP recorded. (Menstrual status: IUD). Physical Exam Constitutional:      General: She is not in acute distress.    Appearance: Normal appearance.  HENT:     Head: Normocephalic and atraumatic.  Eyes:     General: No scleral icterus.    Conjunctiva/sclera: Conjunctivae normal.  Neurological:     General: No  focal deficit present.     Mental Status: She is alert and oriented to person, place, and time.     Cranial Nerves: No cranial nerve deficit.  Psychiatric:        Mood and Affect: Mood normal.        Behavior: Behavior normal.        Judgment: Judgment normal.    Female chaperone present for pelvic and breast  portions of the physical exam  Assessment: 30 y.o. G62P2002 female here for  1. Class 3 severe obesity due to excess calories without serious comorbidity with body mass index (BMI) of 40.0 to 44.9 in adult [facility])   2. BMI 40.0-44.9, adult [facility])      Plan: Problem List Items Addressed This Visit       Other   BMI 40.0-44.9, adult (HCC)   Relevant Medications   phentermine 37.5 MG capsule   Obesity - Primary   Relevant Medications   phentermine 37.5 MG capsule  Will continue with the weight loss medication for another month.  She understands that this is the last month we can use the medication consecutively.  After 3 months of this medication a break must be taken and she understands all questions answered.  A total of 22 minutes were spent face-to-face with the patient as well as preparation, review, communication, and documentation during this encounter.    03-19-1981, MD 02/05/2021 5:56 PM

## 2021-04-09 ENCOUNTER — Ambulatory Visit: Payer: Self-pay | Admitting: Obstetrics and Gynecology

## 2021-04-20 ENCOUNTER — Other Ambulatory Visit: Payer: Self-pay | Admitting: Obstetrics and Gynecology

## 2021-04-20 DIAGNOSIS — E039 Hypothyroidism, unspecified: Secondary | ICD-10-CM

## 2021-04-20 MED ORDER — LEVOTHYROXINE SODIUM 50 MCG PO TABS: 50.0000 ug | ORAL_TABLET | Freq: Every day | ORAL | 4 refills | Status: AC

## 2021-12-16 ENCOUNTER — Ambulatory Visit (LOCAL_COMMUNITY_HEALTH_CENTER): Payer: Self-pay | Admitting: Nurse Practitioner

## 2021-12-16 ENCOUNTER — Encounter: Payer: Self-pay | Admitting: Nurse Practitioner

## 2021-12-16 VITALS — BP 120/84 | HR 86 | Ht 62.0 in | Wt 241.0 lb

## 2021-12-16 DIAGNOSIS — Z3009 Encounter for other general counseling and advice on contraception: Secondary | ICD-10-CM

## 2021-12-16 DIAGNOSIS — Z0000 Encounter for general adult medical examination without abnormal findings: Secondary | ICD-10-CM

## 2021-12-16 NOTE — Progress Notes (Signed)
[facility] DEPARTMENT [facility] [address] Main Number: [phone]    Family Planning Visit- Initial Visit  Subjective:  Carla [name] is a 31 y.o.  G2P2002   being seen today for an initial annual visit and to discuss reproductive life planning.  Carla [name] is currently using IUD or IUS for pregnancy prevention. [name] reports   does not want a pregnancy in Carla next year.     report they are looking for a method that provides High efficacy at preventing pregnancy  [name] has Carla following medical conditions has BMI 40.0-44.9, adult (HCC); Acquired hypothyroidism; Obesity; and Migraine on their problem list.  Chief Complaint  [name] presents with   Annual Exam    [name] reports to clinic today for a physical.    Body mass index is 44.08 kg/m. - [name] is eligible for diabetes screening based on BMI and age >8?  not applicable HA1C ordered? not applicable  [name] reports 1  partner/s in last year. Desires STI screening?  No - refused  Has [name] been screened once for HCV in Carla past?  No  No results found for: "HCVAB"  Does Carla [name] have current drug use (including MJ), have a partner with drug use, and/or has been incarcerated since last result? No  If yes-- Screen for HCV through [facility]   Does Carla [name] meet criteria for HBV testing? No  Criteria:  -Household, sexual or needle sharing contact with HBV -History of drug use -HIV positive -Those with known Hep C   Health Maintenance Due  Topic Date Due   Hepatitis C Screening  Never done   COVID-19 Vaccine (4 - Pfizer series) 06/18/2020    Review of Systems  Constitutional:  Negative for chills, fever, malaise/fatigue and weight loss.       Fluctuation of weight   HENT:  Negative for congestion, hearing loss and sore throat.   Eyes:  Negative for blurred vision, double vision and photophobia.  Respiratory:  Negative for shortness of breath.    Cardiovascular:  Negative for chest pain.  Gastrointestinal:  Negative for abdominal pain, blood in stool, constipation, diarrhea, heartburn, nausea and vomiting.  Genitourinary:  Negative for dysuria and frequency.  Musculoskeletal:  Negative for back pain, joint pain and neck pain.  Skin:  Negative for itching and rash.  Neurological:  Positive for headaches. Negative for dizziness and weakness.  Endo/Heme/Allergies:  Does not bruise/bleed easily.  Psychiatric/Behavioral:  Negative for depression, substance abuse and suicidal ideas.     Carla following portions of Carla [name]'s history were reviewed and updated as appropriate: allergies, current medications, past family history, past medical history, past social history, past surgical history and problem list. Problem list updated.   See flowsheet for other program required questions.  Objective:   Vitals:   12/16/21 1529  BP: 120/84  Pulse: 86  Weight: 241 lb (109.3 kg)  Height: 5\' 2"  (1.575 m)    Physical Exam Constitutional:      Appearance: Normal appearance.  HENT:     Head: Normocephalic. No abrasion, masses or laceration. Hair is normal.     Jaw: No tenderness or swelling.     Right Ear: External ear normal.     Left Ear: External ear normal.     Nose: Nose normal.     Mouth/Throat:     Lips: Pink. No lesions.     Mouth: Mucous membranes are moist. No lacerations.  Dentition: No dental caries.     Tongue: No lesions.     Palate: No mass and lesions.     Pharynx: No pharyngeal swelling, oropharyngeal exudate, posterior oropharyngeal erythema or uvula swelling.     Tonsils: No tonsillar exudate or tonsillar abscesses.  Eyes:     Pupils: Pupils are equal, round, and reactive to light.  Neck:     Thyroid: No thyroid mass, thyromegaly or thyroid tenderness.  Cardiovascular:     Rate and Rhythm: Normal rate and regular rhythm.  Pulmonary:     Effort: Pulmonary effort is normal.     Breath sounds: Normal breath  sounds.  Chest:  Breasts:    Right: Tenderness present. No swelling, mass, nipple discharge or skin change.     Left: Normal. No swelling, mass, nipple discharge, skin change or tenderness.  Abdominal:     General: Abdomen is flat. Bowel sounds are normal.     Palpations: Abdomen is soft.     Tenderness: There is no abdominal tenderness. There is no rebound.  Genitourinary:    Comments: Deferred, no genital exam performed today. Musculoskeletal:     Cervical back: Full passive range of motion without pain and normal range of motion.  Lymphadenopathy:     Cervical: No cervical adenopathy.     Right cervical: No superficial, deep or posterior cervical adenopathy.    Left cervical: No superficial, deep or posterior cervical adenopathy.     Upper Body:     Right upper body: No supraclavicular, axillary or epitrochlear adenopathy.     Left upper body: No supraclavicular, axillary or epitrochlear adenopathy.  Skin:    General: Skin is warm and dry.     Findings: No erythema, laceration, lesion or rash.  Neurological:     Mental Status: Carla [name] is alert and oriented to person, place, and time.  Psychiatric:        Attention and Perception: Attention normal.        Mood and Affect: Mood normal.        Speech: Speech normal.        Behavior: Behavior normal. Behavior is cooperative.       Assessment and Plan:  Carla [name] is a 31 y.o. female presenting to [facility] Department for an initial annual wellness/contraceptive visit  Contraception counseling: Reviewed options based on [name] desire and reproductive life plan. [name] is interested in continuing with current IUD or IUS.   Risks, benefits, and typical effectiveness rates were reviewed.  Questions were answered.  Written information was also given to Carla [name] to review.    Carla [name] will follow up in  1 years for surveillance.  Carla [name] was told to call with any further questions, or with any concerns  about this method of contraception.  Emphasized use of condoms 100% of Carla time for STI prevention.  Need for ECP was assessed. ECP not offered due to continuous use of a birth control method.  1. Family planning counseling -31 year old female in clinic today for a physical. -ROS reviewed [name] states that Carla [name] has a history of migraines and PCOS.  Carla [name] continues to strive to loose weight and her migraines have improved where Carla [name] hasn't had to use medication in Carla last 2 years.  No other complaints noted. -[name] to continue with IUD as birth control method.   -Breast tenderness noted on CBE.  [name] reports frequent episodes of breast tenderness.  Advised [name] to follow up for  additional evaluation.  Also recommended that [name] continue to wear bras that are appropriate size and limit under wire and go without a bra when appropriate.  [name] also informed that vitamin E or B6 may also be therapeutic for breast pain.   -[name] declines STD testing today.   2. Well woman exam (no gynecological exam) -Normal well woman exam. -CBE today, Next due 12/2024 -PAP done 01/05/21     Return in about 1 year (around 12/17/2022) for Annual well-woman exam.    Glenna Fellows, FNP
